# Patient Record
Sex: Female | Born: 1955 | Race: White | Hispanic: No | Marital: Married | Smoking: Never smoker
Health system: Southern US, Community
[De-identification: ages and names within clinical notes are randomized; demographics above are authoritative.]

## PROBLEM LIST (undated history)

## (undated) DIAGNOSIS — M479 Spondylosis, unspecified: Secondary | ICD-10-CM

## (undated) DIAGNOSIS — C50919 Malignant neoplasm of unspecified site of unspecified female breast: Secondary | ICD-10-CM

## (undated) DIAGNOSIS — T7840XA Allergy, unspecified, initial encounter: Secondary | ICD-10-CM

---

## 2011-03-06 ENCOUNTER — Ambulatory Visit
Admission: RE | Admit: 2011-03-06 | Discharge: 2011-03-06 | Disposition: A | Discharge status: Home / Self Care | Payer: BC Managed Care – PPO | Source: Ambulatory Visit | Attending: Family Medicine | Admitting: Family Medicine

## 2011-03-06 ENCOUNTER — Other Ambulatory Visit: Payer: Self-pay | Admitting: Family Medicine

## 2011-03-06 DIAGNOSIS — M545 Low back pain, unspecified: Secondary | ICD-10-CM

## 2011-04-17 ENCOUNTER — Other Ambulatory Visit: Payer: Self-pay | Admitting: Family Medicine

## 2011-04-17 DIAGNOSIS — Z1231 Encounter for screening mammogram for malignant neoplasm of breast: Secondary | ICD-10-CM

## 2011-05-09 ENCOUNTER — Ambulatory Visit
Admission: RE | Admit: 2011-05-09 | Discharge: 2011-05-09 | Disposition: A | Discharge status: Home / Self Care | Payer: BC Managed Care – PPO | Source: Ambulatory Visit | Attending: Family Medicine | Admitting: Family Medicine

## 2011-05-09 DIAGNOSIS — Z1231 Encounter for screening mammogram for malignant neoplasm of breast: Secondary | ICD-10-CM

## 2011-07-10 ENCOUNTER — Ambulatory Visit: Payer: BC Managed Care – PPO | Attending: Family Medicine

## 2011-07-10 DIAGNOSIS — M545 Low back pain, unspecified: Secondary | ICD-10-CM | POA: Insufficient documentation

## 2011-07-10 DIAGNOSIS — IMO0001 Reserved for inherently not codable concepts without codable children: Secondary | ICD-10-CM | POA: Insufficient documentation

## 2011-07-10 DIAGNOSIS — R5381 Other malaise: Secondary | ICD-10-CM | POA: Insufficient documentation

## 2011-07-10 DIAGNOSIS — M25559 Pain in unspecified hip: Secondary | ICD-10-CM | POA: Insufficient documentation

## 2011-07-20 ENCOUNTER — Ambulatory Visit: Payer: BC Managed Care – PPO

## 2011-07-23 ENCOUNTER — Ambulatory Visit: Payer: BC Managed Care – PPO | Admitting: Physical Therapy

## 2011-07-26 ENCOUNTER — Ambulatory Visit: Payer: BC Managed Care – PPO

## 2011-08-06 ENCOUNTER — Ambulatory Visit: Payer: BC Managed Care – PPO | Attending: Family Medicine

## 2011-08-06 DIAGNOSIS — M25559 Pain in unspecified hip: Secondary | ICD-10-CM | POA: Insufficient documentation

## 2011-08-06 DIAGNOSIS — M545 Low back pain, unspecified: Secondary | ICD-10-CM | POA: Insufficient documentation

## 2011-08-06 DIAGNOSIS — R5381 Other malaise: Secondary | ICD-10-CM | POA: Insufficient documentation

## 2011-08-06 DIAGNOSIS — IMO0001 Reserved for inherently not codable concepts without codable children: Secondary | ICD-10-CM | POA: Insufficient documentation

## 2011-08-08 ENCOUNTER — Ambulatory Visit: Payer: BC Managed Care – PPO

## 2012-09-02 ENCOUNTER — Other Ambulatory Visit: Payer: Self-pay

## 2012-09-02 DIAGNOSIS — Z1231 Encounter for screening mammogram for malignant neoplasm of breast: Secondary | ICD-10-CM

## 2012-10-07 ENCOUNTER — Ambulatory Visit
Admission: RE | Admit: 2012-10-07 | Discharge: 2012-10-07 | Disposition: A | Discharge status: Home / Self Care | Payer: BC Managed Care – PPO | Source: Ambulatory Visit

## 2012-10-07 DIAGNOSIS — Z1231 Encounter for screening mammogram for malignant neoplasm of breast: Secondary | ICD-10-CM

## 2012-10-08 ENCOUNTER — Other Ambulatory Visit: Payer: Self-pay | Admitting: Family Medicine

## 2012-10-08 DIAGNOSIS — R928 Other abnormal and inconclusive findings on diagnostic imaging of breast: Secondary | ICD-10-CM

## 2012-10-27 ENCOUNTER — Other Ambulatory Visit (HOSPITAL_COMMUNITY): Payer: Self-pay | Admitting: Diagnostic Radiology

## 2012-10-27 ENCOUNTER — Ambulatory Visit
Admission: RE | Admit: 2012-10-27 | Discharge: 2012-10-27 | Disposition: A | Discharge status: Home / Self Care | Payer: BC Managed Care – PPO | Source: Ambulatory Visit | Attending: Family Medicine | Admitting: Family Medicine

## 2012-10-27 DIAGNOSIS — R928 Other abnormal and inconclusive findings on diagnostic imaging of breast: Secondary | ICD-10-CM

## 2012-10-28 ENCOUNTER — Other Ambulatory Visit: Payer: Self-pay | Admitting: Family Medicine

## 2012-10-28 DIAGNOSIS — C50912 Malignant neoplasm of unspecified site of left female breast: Secondary | ICD-10-CM

## 2012-10-29 ENCOUNTER — Other Ambulatory Visit (HOSPITAL_COMMUNITY)
Admission: RE | Admit: 2012-10-29 | Discharge: 2012-10-29 | Disposition: A | Discharge status: Home / Self Care | Payer: BC Managed Care – PPO | Source: Ambulatory Visit | Attending: Family Medicine | Admitting: Family Medicine

## 2012-10-29 ENCOUNTER — Other Ambulatory Visit: Payer: Self-pay | Admitting: Family Medicine

## 2012-10-29 DIAGNOSIS — Z124 Encounter for screening for malignant neoplasm of cervix: Secondary | ICD-10-CM | POA: Insufficient documentation

## 2012-10-29 DIAGNOSIS — Z1151 Encounter for screening for human papillomavirus (HPV): Secondary | ICD-10-CM | POA: Insufficient documentation

## 2012-11-04 ENCOUNTER — Ambulatory Visit (INDEPENDENT_AMBULATORY_CARE_PROVIDER_SITE_OTHER): Payer: BC Managed Care – PPO | Admitting: Surgery

## 2012-11-04 ENCOUNTER — Ambulatory Visit
Admission: RE | Admit: 2012-11-04 | Discharge: 2012-11-04 | Disposition: A | Discharge status: Home / Self Care | Payer: BC Managed Care – PPO | Source: Ambulatory Visit | Attending: Family Medicine | Admitting: Family Medicine

## 2012-11-04 ENCOUNTER — Encounter (INDEPENDENT_AMBULATORY_CARE_PROVIDER_SITE_OTHER): Payer: Self-pay | Admitting: Surgery

## 2012-11-04 ENCOUNTER — Other Ambulatory Visit (INDEPENDENT_AMBULATORY_CARE_PROVIDER_SITE_OTHER): Payer: Self-pay

## 2012-11-04 VITALS — BP 118/76 | HR 82 | Temp 98.0°F | Resp 15

## 2012-11-04 DIAGNOSIS — D0591 Unspecified type of carcinoma in situ of right breast: Secondary | ICD-10-CM

## 2012-11-04 DIAGNOSIS — D0590 Unspecified type of carcinoma in situ of unspecified breast: Secondary | ICD-10-CM

## 2012-11-04 DIAGNOSIS — C50912 Malignant neoplasm of unspecified site of left female breast: Secondary | ICD-10-CM

## 2012-11-04 DIAGNOSIS — C50919 Malignant neoplasm of unspecified site of unspecified female breast: Secondary | ICD-10-CM | POA: Insufficient documentation

## 2012-11-04 MED ORDER — GADOBENATE DIMEGLUMINE 529 MG/ML IV SOLN
20.0000 mL | Freq: Once | INTRAVENOUS | Status: AC | PRN
  Administered 2012-11-04: 20 mL via INTRAVENOUS

## 2012-11-04 NOTE — Progress Notes (Addendum)
Re:   Shelby Collier DOB:   18-Sep-1955 MRN:   [id number]  ASSESSMENT AND PLAN: 1.  Left breast cancer, DCIS (Tis, N0)  ER - 100%, PR - 100%  11.3 cm area in the UOQ and LOQ of the left breast that is abnormal on MRI.  I discussed the options for breast cancer treatment with the patient.  I discussed a multidisciplinary approach to the treatment of breast cancer, which includes medical oncology and radiation oncology.  I discussed the surgical options of lumpectomy vs. mastectomy.  If mastectomy, there is the possibility of reconstruction.   I discussed the options of lymph node biopsy.  The treatment plan depends on the pathologic staging of the tumor and the patient's personal wishes.  The risks of surgery include, but are not limited to, bleeding, infection, the need for further surgery, and nerve injury.  The patient has been given literature on the treatment of breast cancer.  I expect that she will need a left mastectomy - but the patient was not ready to proceed with that today.  My review of the MRI set her back from what she expected.  Plan:  1) biopsy right breast - MRI guided, 2) consult plastic surgery, 3) consult radiation oncology. I will talk to her after the right breast biopsy.  [Talked to patient by phone.  She's in [place].  She wants a second left breast biopsy.  I discussed Tamoxifen with her.  But she did not commit to that.  She'll check with [place] tomorrow when she gets back.  DN  11/06/2012] [Second biopsy on 11/11/2012 of left breast showed DCIS.  She'll need a mastectomy on the left.  Her biopsy on the right showed a papilloma - so she'll need a lumpectomy on the right.  She saw Dr. Michell Heinrich and Dr. Welton Flakes 11/13/2012 - she was placed on Anastrozole by Dr. Welton Flakes while she travels.  She saw Dr. Odis Luster, but did not like his options.  So she has an appt at [facility] 12/17/2012 with a Engineer, petroleum.  She is going to [place].  She will call me back after she gets her opinion at [facility].   I talked to her on the phone.  DN  11/17/2012]  2.  Right breast breast abnormality on MRI  This needs a biopsy to decide the next step. 3.  Busy schedule  In the mix of this diagnosis, she is a very busy women with a lot of travel planned for the next 2 months.  Chief Complaint  Patient presents with  . New Evaluation    new br ca   REFERRING PHYSICIAN: Emeterio Reeve, MD  HISTORY OF PRESENT ILLNESS: Shelby Collier is a 57 y.o. (DOB: [birth year])  whtie  female whose primary care physician is Emeterio Reeve, MD and comes to me today for left breast cancer.  She is by herself.  She is very healthy.  Her mother had breast cancer at age 70.  Her mother had a lumpectomy, then required a mastectomy for DCIS.  She had a reconstruction with a TRAM, but Shelby Collier was not impressed with the results.  Her LMP was Feb 2011.  She is not on hormone replacement.  Her last mammogram was about 16 months ago.  Mammogram 10/07/2012 - Breast density category 3, Microcalcifications in the left breast. Biopsy of left breast - 10/27/2012 - DCIS  (Accession: [id number]) MRI - 11/04/2012 - non mass enhancement in upper and lower outer quadrant - 11.3 cm  in AP dimension   History reviewed. No pertinent past medical history.   Past Surgical History  Procedure Laterality Date  . Cesarean section  1994 1996    2x     Current Outpatient Prescriptions  Medication Sig Dispense Refill  . ibuprofen (ADVIL,MOTRIN) 200 MG tablet Take 200 mg by mouth every 6 (six) hours as needed for pain.       No current facility-administered medications for this visit.    Allergies  Allergen Reactions  . Codeine Nausea And Vomiting   REVIEW OF SYSTEMS: Skin:  No history of rash.  No history of abnormal moles. Infection:  No history of hepatitis or HIV.  No history of MRSA. Neurologic:  No history of stroke.  No history of seizure.  No history of headaches. Cardiac:  No history of hypertension. No history of heart  disease.   No history of seeing a cardiologist. Pulmonary:  Does not smoke cigarettes.  No asthma or bronchitis.  No OSA/CPAP.  Endocrine:  No diabetes. No thyroid disease. Gastrointestinal:  No history of stomach disease.  No history of liver disease.  No history of gall bladder disease.  No history of pancreas disease.  No history of colon disease. Has never had a colonoscopy, but she knows she needs this done. Urologic:  No history of kidney stones.  No history of bladder infections. Musculoskeletal:  No history of joint or back disease. Hematologic:  No bleeding disorder.  No history of anemia.  Not anticoagulated. Psycho-social:  The patient is oriented.   The patient has no obvious psychologic or social impairment to understanding our conversation and plan.  SOCIAL and FAMILY HISTORY: Married. She works as Personal assistant. He husband has a business that takes him to [place].  He makes wearable devices. Theymoved from [place], [place] about 4 years ago. Two children 18, 19 at [facility] [employer].  PHYSICAL EXAM: BP 118/76  Pulse 82  Temp(Src) 98 F (36.7 C) (Temporal)  Resp 15  General: WN WF who is alert and generally healthy appearing.  Has long white hair. HEENT: Normal. Pupils equal. Neck: Supple. No mass.  No thyroid mass. Lymph Nodes:  No supraclavicular, cervical, or axillary node. Lungs: Clear to auscultation and symmetric breath sounds. Heart:  RRR. No murmur or rub. Breasts:  Right:  No mass.  Left:  No mass.  Bruise at 3 o'clock from biopsy.  Abdomen: Soft. No mass. No tenderness. No hernia. Normal bowel sounds.  Pfannenstiel incision from C sections. Extremities:  Good strength and ROM  in upper and lower extremities. Neurologic:  Grossly intact to motor and sensory function. Psychiatric: Has normal mood and affect. Behavior is normal.   DATA REVIEWED: Path and MRI.  Copies to patient.  Ovidio Kin, MD,  [facility] [address]   [address]   [place], [place]    [address] Phone:  [phone] FAX:  [phone]

## 2012-11-05 ENCOUNTER — Other Ambulatory Visit (INDEPENDENT_AMBULATORY_CARE_PROVIDER_SITE_OTHER): Payer: Self-pay | Admitting: Surgery

## 2012-11-05 ENCOUNTER — Telehealth (INDEPENDENT_AMBULATORY_CARE_PROVIDER_SITE_OTHER): Payer: Self-pay | Admitting: General Surgery

## 2012-11-05 DIAGNOSIS — D0591 Unspecified type of carcinoma in situ of right breast: Secondary | ICD-10-CM

## 2012-11-05 DIAGNOSIS — R928 Other abnormal and inconclusive findings on diagnostic imaging of breast: Secondary | ICD-10-CM

## 2012-11-05 NOTE — Telephone Encounter (Signed)
Patient is aware of appt with Dr Odis Luster 11/13/12 730am

## 2012-11-07 ENCOUNTER — Other Ambulatory Visit (INDEPENDENT_AMBULATORY_CARE_PROVIDER_SITE_OTHER): Payer: Self-pay

## 2012-11-07 DIAGNOSIS — D0592 Unspecified type of carcinoma in situ of left breast: Secondary | ICD-10-CM

## 2012-11-07 DIAGNOSIS — R928 Other abnormal and inconclusive findings on diagnostic imaging of breast: Secondary | ICD-10-CM

## 2012-11-07 DIAGNOSIS — D0591 Unspecified type of carcinoma in situ of right breast: Secondary | ICD-10-CM

## 2012-11-11 ENCOUNTER — Ambulatory Visit
Admission: RE | Admit: 2012-11-11 | Discharge: 2012-11-11 | Disposition: A | Discharge status: Home / Self Care | Payer: BC Managed Care – PPO | Source: Ambulatory Visit | Attending: Surgery | Admitting: Surgery

## 2012-11-11 MED ORDER — GADOBENATE DIMEGLUMINE 529 MG/ML IV SOLN
20.0000 mL | Freq: Once | INTRAVENOUS | Status: AC | PRN
  Administered 2012-11-11: 20 mL via INTRAVENOUS

## 2012-11-12 ENCOUNTER — Ambulatory Visit: Payer: BC Managed Care – PPO | Admitting: Radiation Oncology

## 2012-11-12 NOTE — Progress Notes (Signed)
Location of Breast Cancer:left bx  3 o'clock  10/27/12  Left Breast MRI BX7/15/14=upper outer and lower outer 11.3cm non mass enhancement=per Dr.Hillard phone  Call, will sign   reults shortly,  Left upper outer quad=Ductal carcinoma in situ,  Right breast  MRI bx on 11/11/12 pending=lower inner 3 cm non mass enhancement= Lower inner quad= intraductal (partial tissue), fibrocystic changes,hyperplasia,intermediate grade   Histology per Pathology Report: 11/11/12: Right and Left BXs:  10/27/12 ;Breast, left, needle core biopsy- DUCTAL CARCINOMA IN SITU WITH CALCIFICATIONS  Receptor Status: ER(+), PR (+), Her2-neu(  )  Did patient present with symptoms (if so, please note symptoms) or was this found on screening mammography?: mammogram 10/07/12, MRI 11/04/12  Past/Anticipated interventions by surgeon, if UJW:JXBJY breast bx 11/11/12 done,pending, plastic surgery consult,possible  Reconstructive,(  lumpectomy/vs mastectomy discussed) Dr. Ovidio Kin, met with Dr. Ezzard Standing last week and Dr. Odis Luster this am 11/13/12, discusses litismuss  flap and small implant  Past/Anticipated interventions by medical oncology, if any: Chemotherapy No,  Lymphedema issues, if any:  NO Pain issues, if any:  SAFETY ISSUES: yes, b/l breast, bruising  From recent bx's, hurts from pressure of bra removing , steri strips and bandaid over b/l breasts  Prior radiation? no  Pacemaker/ICD? no  Possible current pregnancy?no  Is the patient on methotrexate? 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

## 2012-11-13 ENCOUNTER — Encounter: Payer: Self-pay | Admitting: Radiation Oncology

## 2012-11-13 ENCOUNTER — Encounter: Payer: Self-pay | Admitting: Oncology

## 2012-11-13 ENCOUNTER — Other Ambulatory Visit (HOSPITAL_BASED_OUTPATIENT_CLINIC_OR_DEPARTMENT_OTHER): Payer: BC Managed Care – PPO | Admitting: Lab

## 2012-11-13 ENCOUNTER — Ambulatory Visit
Admission: RE | Admit: 2012-11-13 | Discharge: 2012-11-13 | Disposition: A | Discharge status: Home / Self Care | Payer: BC Managed Care – PPO | Source: Ambulatory Visit | Attending: Radiation Oncology | Admitting: Radiation Oncology

## 2012-11-13 ENCOUNTER — Other Ambulatory Visit: Payer: Self-pay | Admitting: *Deleted

## 2012-11-13 ENCOUNTER — Telehealth: Payer: Self-pay | Admitting: *Deleted

## 2012-11-13 ENCOUNTER — Ambulatory Visit (HOSPITAL_BASED_OUTPATIENT_CLINIC_OR_DEPARTMENT_OTHER): Payer: BC Managed Care – PPO | Admitting: Oncology

## 2012-11-13 ENCOUNTER — Ambulatory Visit: Payer: BC Managed Care – PPO

## 2012-11-13 VITALS — BP 136/81 | HR 79 | Temp 97.8°F | Resp 20 | Ht 64.0 in | Wt 216.6 lb

## 2012-11-13 VITALS — BP 139/88 | HR 84 | Temp 98.5°F | Resp 20 | Ht 64.0 in | Wt 215.9 lb

## 2012-11-13 DIAGNOSIS — Z170 Estrogen receptor positive status [ER+]: Secondary | ICD-10-CM

## 2012-11-13 DIAGNOSIS — D0590 Unspecified type of carcinoma in situ of unspecified breast: Secondary | ICD-10-CM

## 2012-11-13 DIAGNOSIS — C50911 Malignant neoplasm of unspecified site of right female breast: Secondary | ICD-10-CM

## 2012-11-13 DIAGNOSIS — C50912 Malignant neoplasm of unspecified site of left female breast: Secondary | ICD-10-CM

## 2012-11-13 DIAGNOSIS — D249 Benign neoplasm of unspecified breast: Secondary | ICD-10-CM | POA: Insufficient documentation

## 2012-11-13 DIAGNOSIS — Z780 Asymptomatic menopausal state: Secondary | ICD-10-CM | POA: Insufficient documentation

## 2012-11-13 DIAGNOSIS — D0510 Intraductal carcinoma in situ of unspecified breast: Secondary | ICD-10-CM

## 2012-11-13 HISTORY — DX: Spondylosis, unspecified: M47.9

## 2012-11-13 HISTORY — DX: Allergy, unspecified, initial encounter: T78.40XA

## 2012-11-13 HISTORY — DX: Malignant neoplasm of unspecified site of unspecified female breast: C50.919

## 2012-11-13 LAB — COMPREHENSIVE METABOLIC PANEL (CC13)
Albumin: 4.2 g/dL (ref 3.5–5.0)
BUN: 9.6 mg/dL (ref 7.0–26.0)
CO2: 26 mEq/L (ref 22–29)
Calcium: 9.9 mg/dL (ref 8.4–10.4)
Chloride: 106 mEq/L (ref 98–109)
Glucose: 124 mg/dl (ref 70–140)
Potassium: 3.7 mEq/L (ref 3.5–5.1)
Total Protein: 7.2 g/dL (ref 6.4–8.3)

## 2012-11-13 LAB — CBC WITH DIFFERENTIAL/PLATELET
Basophils Absolute: 0.1 10*3/uL (ref 0.0–0.1)
Eosinophils Absolute: 0 10*3/uL (ref 0.0–0.5)
HGB: 12.6 g/dL (ref 11.6–15.9)
MCV: 87.4 fL (ref 79.5–101.0)
MONO#: 0.2 10*3/uL (ref 0.1–0.9)
MONO%: 4.6 % (ref 0.0–14.0)
NEUT#: 2.9 10*3/uL (ref 1.5–6.5)
RDW: 13.3 % (ref 11.2–14.5)
WBC: 5.3 10*3/uL (ref 3.9–10.3)
lymph#: 2.1 10*3/uL (ref 0.9–3.3)

## 2012-11-13 MED ORDER — ANASTROZOLE 1 MG PO TABS: 1.0000 mg | ORAL_TABLET | Freq: Every day | ORAL | Status: AC

## 2012-11-13 NOTE — Progress Notes (Signed)
Radiation Oncology         [phone]) [phone] ________________________________  Initial outpatient Consultation - Date: 11/13/2012   Name: SHANIKIA KERNODLE MRN: [id number]   DOB: 16-Aug-1955  REFERRING PHYSICIAN: Kandis Cocking, MD  DIAGNOSIS: The encounter diagnosis was Breast cancer, left.  HISTORY OF PRESENT ILLNESS::Cataleyah L Pitter is a 57 y.o. female  who underwent a screening mammogram in June of this year. Calcifications were noted in the left breast. Followup images were performed which showed worrisome calcifications. A stereotactic guided biopsy was performed which showed DCIS with calcifications which was low to intermediate grade. The tumor was 100% estrogen +100% progesterone positive. An MRI of the bilateral breasts was performed on 11/05/2012 which showed an 11.3 cm area of non-masslike enhancement in the upper and lower outer left breast. An area of non-mass enhancement was also noted in the lower inner right breast measuring 1.9 x 2.7 x 3 cm. No abnormal appearing lymph nodes were noted. 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 She is recovered well from her biopsies. Her left breast is tender. She has some bruising associated with her biopsy sites. She did not palpate anything in her left breast. Interestingly she had a mother with a similar history of DCIS with comedonecrosis treated with local excision followed by multiple re\re excisions and eventually mastectomy followed by TRAM flap reconstruction 25 years ago. She has 2 sons. She is GX P2. She is postmenopausal. She was never on hormone replacement. She has multiple work-related of sentence in the upcoming months. In particular she and her sons have a trip planned to visit her husband in [place]. He lives there permanently. This trip is scheduled  from July 30 2 mid August. She has discussed mastectomy with Dr. Ezzard Standing and has met with Dr. Odis Luster regarding reconstruction options.  PREVIOUS RADIATION THERAPY: No  PAST MEDICAL HISTORY:  has a past medical history of Breast cancer; Allergy; and Spondylosis.    PAST SURGICAL HISTORY: Past Surgical History  Procedure Laterality Date  . Cesarean section  1994 1996    2x     FAMILY HISTORY:  Family History  Problem Relation Age of Onset  . Cancer Mother   . Cancer Father     SOCIAL HISTORY:  History  Substance Use Topics  . Smoking status: Never Smoker   . Smokeless tobacco: Never Used  . Alcohol Use: 3.0 oz/week    5 Glasses of wine per week     Comment: weekly    ALLERGIES: Codeine  MEDICATIONS:  Current Outpatient Prescriptions  Medication Sig Dispense Refill  . ibuprofen (ADVIL,MOTRIN) 200 MG tablet Take 200 mg by mouth every 6 (six) hours as needed for pain.       No current facility-administered medications for this encounter.    REVIEW OF SYSTEMS:  A 15 point review of systems is documented in the electronic medical record. This was obtained by the nursing staff. However, I reviewed this with the patient to discuss relevant findings and make appropriate changes.  Pertinent items are noted in HPI.   PHYSICAL EXAM:  Filed Vitals:   11/13/12 1035  BP: 136/81  Pulse: 79  Temp: 97.8 F (36.6 C)  Resp: 20  .216 lb 9.6 oz (98.249 kg). She is a pleasant female in no distress sitting comfortably on examining table.  She has no palpable cervical supraclavicular or axillary adenopathy bilaterally. She has bruising in the inner quadrant of the right breast. There is no palpable abnormalities. She has some bruising in the outer quadrant of the left breast and her left breast is tender to palpation. No palpable masses. She is alert and a x3. She has 5 out of 5 strength bilaterally.  LABORATORY DATA:  No results found for this basename: WBC, HGB, HCT, MCV, PLT   No  results found for this basename: NA, K, CL, CO2   No results found for this basename: ALT, AST, GGT, ALKPHOS, BILITOT     RADIOGRAPHY: Mr Breast Bilateral W Wo Contrast  11/05/2012   **ADDENDUM** CREATED: 11/05/2012 08:56:17  Please note that in the findings section of the report under "left breast," the first sentence should read: There is non  mass enhancement in the upper and lower outer LEFT breast, spanning up to 11.3 cm in AP dimension.  Addended by:  Jerene Dilling, M.D. on 11/05/2012 08:56:17.  **END ADDENDUM** SIGNED BY: Jerene Dilling, M.D.  11/04/2012   *RADIOLOGY REPORT*  Clinical Data: 57 year-old female with recent diagnosis of DCIS.  LABS  BILATERAL BREAST MRI WITH AND WITHOUT CONTRAST  Technique: Multiplanar, multisequence MR images of both breasts were obtained prior to and following the intravenous administration of 20ml of Multihance.  THREE-DIMENSIONAL MR IMAGE RENDERING ON INDEPENDENT WORKSTATION:  Three-dimensional MR images were rendered by post-processing of the original MR data on an independent workstation.  The three- dimensional MR images were interpreted, and findings are reported in the following complete MRI report for this study.  Comparison:   Recent imaging examinations.  FINDINGS:  Breast composition:  c:  Heterogeneous fibroglandular tissue  Background parenchymal enhancement: Mild  Right breast: Non mass enhancement is identified in the lower inner right breast anteriorly, measuring 1.9 x 2.7 x 3 cm.  No other areas of abnormal enhancement.  Left breast:  There is non mass enhancement in the upper and lower outer right breast, spanning up to 11.3 cm in AP dimension.  This is concerning for DCIS.  More focal area of enhancement is also noted at the biopsy site in the lower outer left breast.  Lymph nodes:   No abnormal appearing lymph nodes.  Ancillary findings:  None.  IMPRESSION: 1.Non mass enhancement in the upper and lower outer left breast around the site of biopsy  proven DCIS. This is concerning for additional areas of DCIS.  2. Nonmass enhancement in the inner lower right breast.  RECOMMENDATION: MRI guided biopsy of non mass enhancement in the lower inner right breast.  BI-RADS CATEGORY 6:  Known biopsy-proven malignancy - appropriate action should be taken.   Original Report Authenticated By: Jerene Dilling, M.D.   Mm Digital Diag Ltd L  10/27/2012   *RADIOLOGY REPORT*  Clinical Data:  Called back from screening mammogram for calcifications left breast  DIGITAL DIAGNOSTIC LEFT MAMMOGRAM October 27, 2012  Comparison: October 08, 2012, May 10, 2011  Findings:  ACR Breast Density Category c :  Heterogeneously dense  Spot magnification CC and lateral views of the left breast are submitted.  There are indeterminate microcalcifications in the lateral left breast.  IMPRESSION: Suspicious findings.  RECOMMENDATION: Stereotactic core biopsy left breast calcifications.  I have discussed the findings and recommendations with the patient. Results were also provided in writing at the conclusion of the visit.  If applicable, a reminder letter will be sent to the patient regarding her next  appointment.  BI-RADS CATEGORY 4:  Suspicious abnormality - biopsy should be considered.   Original Report Authenticated By: Sherian Rein, M.D.   Mm Digital Diagnostic Bilat  11/11/2012   *RADIOLOGY REPORT*  Clinical Data:  Titanium marker placement following MR guided core needle biopsy earlier today.  DIGITAL DIAGNOSTIC BILATERAL MAMMOGRAM  Comparison:  Previous examinations.  Findings:  Digital mammographic images were performed following MR guided biopsy of the posterior, superior aspect of an 11.3 cm area of non mass enhancement in the outer left breast and a 3 cm area of non mass enhancement in the lower inner quadrant of the right breast.  The images of the left breast demonstrate an hourglass shaped biopsy marker clip in the posterior aspect of the outer left breast, slightly  superiorly, at the location of the area of non mass enhancement targeted for biopsy earlier today.  This is located 4.4 cm posterior, superior and lateral to the previously placed T-shaped biopsy marker clip at the location recently biopsied ductal carcinoma in situ.  The images of the right breast demonstrate an hourglass shaped biopsy marker clip in the anterior aspect of the inferior right breast, slightly medially, at the medial aspect of the targeted 3 cm area of non mass enhancement on the recent staging MR.  The biopsy cavity is at the location of the targeted area of concern.  IMPRESSION: Appropriate bilateral clip placement, as described above.   Original Report Authenticated By: Beckie Salts, M.D.   Mm Lt Breast Bx W Loc Dev 1st Lesion Image Bx Spec Stereo Guide  10/28/2012   **ADDENDUM** CREATED: 10/28/2012 13:52:12  Biopsy results revealed ductal carcinoma in situ, low and intermediate grade, concordant with imaging findings. Results were discussed with the patient by telephone at 11:50 a.m. on 10/28/2012. A surgical consultation is made with Dr. Ezzard Standing on 11/04/2012 at 02:15 p.m.  An MRI is scheduled for 11/04/2012 at 08:45 a.m. The patient states she is doing well after the biopsy.  **END ADDENDUM** SIGNED BY: Hiram Gash, M.D.  10/27/2012   *RADIOLOGY REPORT*  Clinical Data:  Suspicious left breast calcifications for biopsy.  STEREOTACTIC-GUIDED VACUUM ASSISTED BIOPSY OF THE LEFT BREAST AND SPECIMEN RADIOGRAPH  Comparison: Previous exams.  I met with the patient and we discussed the procedure of stereotactic-guided biopsy, including benefits and alternatives. We discussed the high likelihood of a successful procedure. We discussed the risks of the procedure, including infection, bleeding, tissue injury, clip migration, and inadequate sampling. Informed, written consent was given. The usual time-out protocol was performed immediately prior to the procedure.  Using sterile technique and 2%  Lidocaine as local anesthetic, under stereotactic guidance, a 9 gauge vacuum-assisted device was used to perform core needle biopsy of calcifications in the lateral left breast using a lateral approach.  Specimen radiograph was performed, showing inclusion of calcifications of concern. Specimens with calcifications are identified for pathology.  At the conclusion of the procedure, a  tissue marker clip was deployed into the biopsy cavity.  Follow-up 2-view mammogram confirmed clip in the area of concern.  IMPRESSION: Stereotactic-guided biopsy of left breast.  No apparent complications.   Original Report Authenticated By: Sherian Rein, M.D.   Mr Oswaldo Milian Breast Bx Jones Bales Dev 1st Lesion Image Bx Spec Mr Guide  11/11/2012   *RADIOLOGY REPORT*  Clinical Data: Recently diagnosed left breast ductal carcinoma in situ.  11.3 cm area of non mass enhancement in the upper outer and lower outer left breast on a staging breast MR. The previously  biopsied ductal carcinoma in situ is within the inferior, anterior portion of this area of enhancement.  MRI GUIDED VACUUM ASSISTED BIOPSY OF THE LEFT BREAST WITHOUT AND WITH CONTRAST  Technique: Multiplanar, multisequence MR images of the left breast were obtained prior to and following the intravenous administration of 20 ml of Multihance.  I met with the patient, and we discussed the procedure of MRI guided biopsy, including the risks of bleeding, infection and clip migration.  We discussed the high likelihood of a successful procedure.  Informed, written consent was given.  Using sterile technique, 2% Lidocaine, MRI guidance, and a 9 gauge vacuum assisted device, biopsy was performed of the posterior, superior aspect of the 11.3 cm area of non mass enhancement seen in the outer left breast on the recent staging MR.  At the conclusion of the procedure, a tissue marker clip was deployed into the biopsy cavity.  Follow-up 2-view mammogram was performed and dictated separately.  IMPRESSION:  MRI guided biopsy of the posterior, superior aspect of the recently demonstrated 11.3 cm area of abnormal enhancement in the outer left breast.  No apparent complications.  THREE-DIMENSIONAL MR IMAGE RENDERING ON INDEPENDENT WORKSTATION:  Three-dimensional MR images were rendered by post-processing of the original MR data on an independent workstation.  The three- dimensional MR images were interpreted, and findings were reported in the accompanying complete MRI report for this study.   Original Report Authenticated By: Beckie Salts, M.D.   Mr Rt Breast Bx Jones Bales Dev 1st Lesion Image Bx Spec Mr Guide  11/11/2012   *RADIOLOGY REPORT*  Clinical Data: 3 cm area of non mass enhancement in the lower inner right breast on a recent staging breast MR.  Recently diagnosed left breast ductal carcinoma in situ.  MRI GUIDED VACUUM ASSISTED BIOPSY OF THE RIGHT BREAST WITHOUT AND WITH CONTRAST  Technique: Multiplanar, multisequence MR images of the right breast were obtained prior to and following the intravenous administration of 20 ml of Multihance.  I met with the patient, and we discussed the procedure of MRI guided biopsy, including the risks of bleeding, infection and clip migration.  We discussed the high likelihood of a successful procedure.  Informed, written consent was given.  Using sterile technique, 2% Lidocaine, MRI guidance, and a 9 gauge vacuum assisted device, biopsy was performed of the recently demonstrated 3 cm area of non mass enhancement in the anterior aspect of the lower inner quadrant of the right breast.  At the conclusion of the procedure, a tissue marker clip was deployed into the biopsy cavity.  Follow-up 2-view mammogram was performed and dictated separately.  IMPRESSION: MRI guided biopsy of a 3 cm area of non mass enhancement in the lower inner quadrant of the right breast.  No apparent complications.  THREE-DIMENSIONAL MR IMAGE RENDERING ON INDEPENDENT WORKSTATION:  Three-dimensional MR images  were rendered by post-processing of the original MR data on an independent workstation.  The three- dimensional MR images were interpreted, and findings were reported in the accompanying complete MRI report for this study.   Original Report Authenticated By: Beckie Salts, M.D.      IMPRESSION: 57 year old female with newly diagnosed DCIS of the left breast and a intraductal papilloma of the right breast  PLAN: I discussed with Greig Castilla the results of her second biopsy which confirmed this large extent of DCIS. We discussed that mastectomy was recommended to 2 size of her lesion as compared to the size of her breast for cosmetic outcome. This was not due  to the aggressiveness of her cancer. I discussed with her there was no indication for postmastectomy radiation in patients who undergo mastectomy for DCIS. Even in the setting of focally positive margins radiation is not indicated. We discussed her options for reconstruction. She is uncomfortable with delaying her surgery and not undergoing any treatment while she is traveling for the next 4-6 weeks. We discussed perhaps placing her on antiestrogen therapy to help ridge her so that she had some oncologic treatment while she was traveling. Dr. Welton Flakes was able to schedule her for an appointment later this afternoon a. She can then discuss surgical planning options with Dr. Ezzard Standing and Dr. Odis Luster when she returns from her trip. She was grateful for her care here. I have not scheduled followup with me. I be happy to see her back at any point in the future and encouraged her to call me with any questions.  I spent 60 minutes  face to face with the patient and more than 50% of that time was spent in counseling and/or coordination of care.   ------------------------------------------------  Lurline Hare, MD

## 2012-11-13 NOTE — Patient Instructions (Addendum)
We discussed your diagnosis and pathology  We will begin neoadjuvant anti-estrogen   arimidex 1 mg daily  We discussed the side effects and more inforamtion as below  Please call with any problems  I will see you back in September or sonner if needed   Anastrozole tablets What is this medicine? ANASTROZOLE (an AS troe zole) is used to treat breast cancer in women who have gone through menopause. Some types of breast cancer depend on estrogen to grow, and this medicine can stop tumor growth by blocking estrogen production. This medicine may be used for other purposes; ask your health care provider or pharmacist if you have questions. What should I tell my health care provider before I take this medicine? They need to know if you have any of these conditions: -liver disease -an unusual or allergic reaction to anastrozole, other medicines, foods, dyes, or preservatives -pregnant or trying to get pregnant -breast-feeding How should I use this medicine? Take this medicine by mouth with a glass of water. Follow the directions on the prescription label. You can take this medicine with or without food. Take your doses at regular intervals. Do not take your medicine more often than directed. Do not stop taking except on the advice of your doctor or health care professional. Talk to your pediatrician regarding the use of this medicine in children. Special care may be needed. Overdosage: If you think you have taken too much of this medicine contact a poison control center or emergency room at once. NOTE: This medicine is only for you. Do not share this medicine with others. What if I miss a dose? If you miss a dose, take it as soon as you can. If it is almost time for your next dose, take only that dose. Do not take double or extra doses. What may interact with this medicine? Do not take this medicine with any of the following medications: -female hormones, like estrogens or progestins and birth  control pills This medicine may also interact with the following medications: -tamoxifen This list may not describe all possible interactions. Give your health care provider a list of all the medicines, herbs, non-prescription drugs, or dietary supplements you use. Also tell them if you smoke, drink alcohol, or use illegal drugs. Some items may interact with your medicine. What should I watch for while using this medicine? Visit your doctor or health care professional for regular checks on your progress. Let your doctor or health care professional know about any unusual vaginal bleeding. Do not treat yourself for diarrhea, nausea, vomiting or other side effects. Ask your doctor or health care professional for advice. What side effects may I notice from receiving this medicine? Side effects that you should report to your doctor or health care professional as soon as possible: -allergic reactions like skin rash, itching or hives, swelling of the face, lips, or tongue -any new or unusual symptoms -breathing problems -chest pain -leg pain or swelling -vomiting Side effects that usually do not require medical attention (report to your doctor or health care professional if they continue or are bothersome): -back or bone pain -cough, or throat infection -diarrhea or constipation -dizziness -headache -hot flashes -loss of appetite -nausea -sweating -weakness and tiredness -weight gain This list may not describe all possible side effects. Call your doctor for medical advice about side effects. You may report side effects to FDA at 1-800-FDA-1088. Where should I keep my medicine? Keep out of the reach of children. Store at room temperature  between 20 and 25 degrees C (68 and 77 degrees F). Throw away any unused medicine after the expiration date. NOTE: This sheet is a summary. It may not cover all possible information. If you have questions about this medicine, talk to your doctor, pharmacist,  or health care provider.  2013, [employer] Standard. (06/27/2007 4:31:52 PM)

## 2012-11-13 NOTE — Telephone Encounter (Signed)
appts made and printed. Date for 9/5/ per pt...td

## 2012-11-13 NOTE — Telephone Encounter (Signed)
Called patholgy, path still pending, spoke with Vata, and she spoke with Dr. Raynald Blend, "he is still waiting on some tissu results, and will call breast ctr when they come in, his number [phone], ", informed her patine has 1030 appt today with MD, will call around 1000am to see if results are in 8:48 AM

## 2012-11-13 NOTE — Telephone Encounter (Signed)
Called pathology again, spoke with [facility] who transferred call to DR. Hillard, he will sign report in next few minutes, will be able to accesses shortly,,thnaked MD

## 2012-11-13 NOTE — Progress Notes (Addendum)
Please see the Nurse Progress Note in the MD Initial Consult Encounter for this patient. 

## 2012-11-13 NOTE — Progress Notes (Signed)
Checked in new patient. Email address on file is her [address]. No financial issues. She has no POA/living will.

## 2012-12-10 NOTE — Progress Notes (Signed)
Shelby Collier [id number] 03-31-1956 57 y.o. 12/10/2012 11:27 PM  CC  Emeterio Reeve, MD [address] [place] [address] Dr. Ovidio Kin Dr. 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 Patient is seen in  medical oncology for discussion of treatment option  STAGE:   No matching staging information was found for the patient.  REFERRING PHYSICIAN: Dr. Ovidio Kin  HISTORY OF PRESENT ILLNESS:  Shelby Collier is a 57 y.o. female.  Who underwent a screening mammogram in June 2014. In the left breast there were calcifications noted. She had followup imaging done that revealed worrisome calcifications. 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 MRI of the bilateral breasts on 11/05/2012 revealed 11.3 cm area of non-masslike enhancement in the upper and lower outer left quadrant. An area of non-mass enhancement was also noted in the lower inner right breast measuring 1.9 x 2.7 x 3 cm. There were no abnormal lymph nodes. 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 Overall patient is doing well. She has been seen by Dr. Ovidio Kin she is also been seen by Dr. Lurline Hare.   Past Medical History: Past Medical History  Diagnosis Date  . Breast cancer     left  . Allergy   . Spondylosis     lunbar/scaral    Past Surgical History: Past Surgical History  Procedure Laterality Date  . Cesarean section  1994 1996    2x     Family History: Family History  Problem Relation Age of Onset  . Cancer Mother   . Cancer Father     Social History History  Substance Use Topics  . Smoking status: Never  Smoker   . Smokeless tobacco: Never Used  . Alcohol Use: 3.0 oz/week    5 Glasses of wine per week     Comment: weekly    Allergies: Allergies  Allergen Reactions  . Codeine Nausea And Vomiting    Current Medications: Current Outpatient Prescriptions  Medication Sig Dispense Refill  . anastrozole (ARIMIDEX) 1 MG tablet Take 1 tablet (1 mg total) by mouth daily.  30 tablet  6  . ibuprofen (ADVIL,MOTRIN) 200 MG tablet Take 200 mg by mouth every 6 (six) hours as needed for pain.       No current facility-administered medications for this visit.    OB/GYN History:patient had menarche at age 61 underwent menopause in 2011 no history of hormone replacement therapy. She's had first live birth at 67  Fertility Discussion: not applicable Prior History of Cancer: no prior history  Health Maintenance:  Colonoscopy  Bone Densityno no Last PAP smear July 2014  ECOG PERFORMANCE STATUS: 0 - Asymptomatic  Genetic Counseling/testing: yes  REVIEW OF SYSTEMS:  A comprehensive review of systems was negative.  PHYSICAL EXAMINATION: Blood pressure 139/88, pulse 84, temperature 98.5 F (36.9 C), temperature source Oral, resp. rate 20, height 5\' 4"  (1.626 m), weight 215 lb 14.4 oz (97.932 kg), last menstrual period 08/14/2009.  UUV:OZDGU, healthy, no distress, well nourished and well developed SKIN: skin color, texture, turgor are normal HEAD: Normocephalic EYES: PERRLA, EOMI, Conjunctiva are pink and non-injected, sclera clear EARS: External ears normal OROPHARYNX:no exudate and  dentition normal  NECK: no adenopathy LYMPH:  no palpable lymphadenopathy, no hepatosplenomegaly BREAST:right breast normal without mass, skin or nipple changes or axillary nodes, left breast normal without mass, skin or nipple changes or axillary nodes LUNGS: clear to auscultation and percussion HEART: regular rate & rhythm ABDOMEN:abdomen soft and non-tender BACK: No CVA tenderness EXTREMITIES:no edema   NEURO: alert & oriented x 3 with fluent speech, no focal motor/sensory deficits     STUDIES/RESULTS: Mm Digital Diagnostic Bilat  11/11/2012   *RADIOLOGY REPORT*  Clinical Data:  Titanium marker placement following MR guided core needle biopsy earlier today.  DIGITAL DIAGNOSTIC BILATERAL MAMMOGRAM  Comparison:  Previous examinations.  Findings:  Digital mammographic images were performed following MR guided biopsy of the posterior, superior aspect of an 11.3 cm area of non mass enhancement in the outer left breast and a 3 cm area of non mass enhancement in the lower inner quadrant of the right breast.  The images of the left breast demonstrate an hourglass shaped biopsy marker clip in the posterior aspect of the outer left breast, slightly superiorly, at the location of the area of non mass enhancement targeted for biopsy earlier today.  This is located 4.4 cm posterior, superior and lateral to the previously placed T-shaped biopsy marker clip at the location recently biopsied ductal carcinoma in situ.  The images of the right breast demonstrate an hourglass shaped biopsy marker clip in the anterior aspect of the inferior right breast, slightly medially, at the medial aspect of the targeted 3 cm area of non mass enhancement on the recent staging MR.  The biopsy cavity is at the location of the targeted area of concern.  IMPRESSION: Appropriate bilateral clip placement, as described above.   Original Report Authenticated By: Beckie Salts, M.D.   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  This is concordant with the imaging findings.  The initial pathological diagnosis was discussed with the patient by telephone on 11/12/2012 with the final results conveyed on 11/13/2012.  She reported some pain at the biopsy site with extensive bruising and no palpable hematoma.  She will  see Dr. Ezzard Standing in follow-up.  **END ADDENDUM** SIGNED BY: Londell Moh. Azucena Kuba, M.D.  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  11.3 cm area of non mass enhancement in the upper outer and lower outer left breast on a staging breast MR. The previously biopsied ductal carcinoma in situ is within the inferior, anterior portion of this area of enhancement.  MRI GUIDED VACUUM ASSISTED BIOPSY OF THE LEFT BREAST WITHOUT AND WITH CONTRAST  Technique: Multiplanar, multisequence MR images of the left breast were obtained prior to and following the intravenous administration of 20 ml of Multihance.  I met with the patient, and we discussed the procedure of MRI guided biopsy, including the risks of bleeding, infection and clip migration.  We discussed the high likelihood of a successful procedure.  Informed, written consent was given.  Using sterile technique, 2% Lidocaine, MRI guidance, and a 9 gauge vacuum assisted device, biopsy was performed of the posterior, superior aspect of the 11.3 cm area of non mass enhancement seen in the outer left breast on the recent staging MR.  At the conclusion of the procedure, a tissue marker clip was deployed into the biopsy cavity.  Follow-up 2-view  mammogram was performed and dictated separately.  IMPRESSION: MRI guided biopsy of the posterior, superior aspect of the recently demonstrated 11.3 cm area of abnormal enhancement in the outer left breast.  No apparent complications.  THREE-DIMENSIONAL MR IMAGE RENDERING ON INDEPENDENT WORKSTATION:  Three-dimensional MR images were rendered by post-processing of the original MR data on an independent workstation.  The three- dimensional MR images were interpreted, and findings were reported in the accompanying complete MRI report for this study.   Original Report Authenticated By: Beckie Salts, M.D.   Mr Rt Breast Bx Jones Bales Dev 1st Lesion Image Bx Spec Mr Guide  11/13/2012    **ADDENDUM** CREATED: 11/13/2012 14:08:37  The addendum stating that there was no bruising on followup was in error.  The patient did report extensive bruising following the biopsy with no palpable hematoma.  **END ADDENDUM** SIGNED BY: Londell Moh. Azucena Kuba, M.D.  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  This is concordant with the imaging findings. Surgical excision is recommended.  The final pathological diagnosis and recommendation were discussed with the patient by telephone on 11/12/2012.  Her questions were answered.  She reported some pain at the biopsy site with no bruising or palpable hematoma.  She will see Dr. Ezzard Standing in follow- up.  **END ADDENDUM** SIGNED BY: Londell Moh. Azucena Kuba, M.D.  11/11/2012   *RADIOLOGY REPORT*  Clinical Data: 3 cm area of non mass enhancement in the lower inner right breast on a recent staging breast MR.  Recently diagnosed left breast ductal carcinoma in situ.  MRI GUIDED VACUUM ASSISTED BIOPSY OF THE RIGHT BREAST WITHOUT AND WITH CONTRAST  Technique: Multiplanar, multisequence MR images of the right breast were obtained prior to and following the intravenous administration of 20 ml of Multihance.  I met with the patient, and we discussed the procedure of MRI guided biopsy, including the risks of bleeding, infection and clip migration.  We discussed the high likelihood of a successful procedure.  Informed, written consent was given.  Using sterile technique, 2% Lidocaine, MRI guidance, and a 9 gauge vacuum assisted device, biopsy was performed of the recently demonstrated 3 cm area of non mass enhancement in the anterior aspect of the lower inner quadrant of the right breast.  At the conclusion of the procedure, a tissue marker clip was deployed into the biopsy cavity.  Follow-up 2-view mammogram was performed and dictated separately.  IMPRESSION: MRI guided biopsy of a 3 cm area of non mass  enhancement in the lower inner quadrant of the right breast.  No apparent complications.  THREE-DIMENSIONAL MR IMAGE RENDERING ON INDEPENDENT WORKSTATION:  Three-dimensional MR images were rendered by post-processing of the original MR data on an independent workstation.  The three- dimensional MR images were interpreted, and findings were reported in the accompanying complete MRI report for this study.   Original Report Authenticated By: Beckie Salts, M.D.     LABS:    Chemistry      Component Value Date/Time   NA 141 11/13/2012 1248   K 3.7 11/13/2012 1248   CO2 26 11/13/2012 1248   BUN 9.6 11/13/2012 1248   CREATININE 0.9 11/13/2012 1248      Component Value Date/Time   CALCIUM 9.9 11/13/2012 1248   ALKPHOS 87 11/13/2012 1248   AST 16 11/13/2012 1248   ALT 15 11/13/2012 1248   BILITOT 0.43 11/13/2012 1248      Lab Results  Component Value Date  WBC 5.3 11/13/2012   HGB 12.6 11/13/2012   HCT 36.6 11/13/2012   MCV 87.4 11/13/2012   PLT 225 11/13/2012   PATHOLOGY: ADDITIONAL INFORMATION: PROGNOSTIC INDICATORS - ACIS Results: IMMUNOHISTOCHEMICAL AND MORPHOMETRIC ANALYSIS BY THE AUTOMATED CELLULAR IMAGING SYSTEM (ACIS) Estrogen Receptor: 100%, POSITIVE, STRONG STAINING INTENSITY Progesterone Receptor: 100%, POSITIVE, STRONG STAINING INTENSITY REFERENCE RANGE ESTROGEN RECEPTOR NEGATIVE <1% POSITIVE =>1% PROGESTERONE RECEPTOR NEGATIVE <1% POSITIVE =>1% All controls stained appropriately Pecola Leisure MD Pathologist, Electronic Signature ( Signed 11/03/2012) FINAL DIAGNOSIS Diagnosis Breast, left, needle core biopsy - DUCTAL CARCINOMA IN SITU WITH CALCIFICATIONS. - SEE COMMENT. Microscopic Comment The carcinoma appears low grade to intermediate grade. Estrogen receptor and progesterone receptor studies will be 1 of 2 Duplicate copy FINAL for FA[facility], FIFE L [phone]) Microscopic Comment(continued) performed and the results reported separately. The results were called to  [facility] on 10/28/12. (JBK:gt, 10/28/12) Pecola Leisure MD Pathologist, Electronic Signature (Case signed 10/28/2012) Diagnosis 1. Breast, left, needle core biopsy, UOQ posteriorly - DUCTAL CARCINOMA IN SITU. - SEE COMMENT. 2. Breast, right, needle core biopsy, LIQ anteriorly - INTRADUCTAL PAPILLOMA, SEE COMMENT. - FIBROCYSTIC CHANGES WITH USUAL DUCTAL HYPERPLASIA. - SEE COMMENT. Microscopic Comment 1. Immunohistochemical stains are performed on the left upper outer quadrant posterior biopsy. A p63, smooth muscle myosin heavy chain and calponin stain all highlight an intact myoepithelial layer. The morphology coupled with the immunohistochemical staining findings are consistent with the above diagnosis. Quantitative estrogen and quantitative progesterone markers will not be performed unless otherwise requested as quantitative markers were performed on the previous left needle core biopsy ([id number]). The ductal carcinoma in situ appears intermediate grade as sampled. The findings are [facility] 11/13/2012. Dr. Colonel Bald has seen the first specimen in consultation with agreement. 2. Although there is no atypia or malignancy identified in the right lower inner quadrant biopsy, the intraductal papilloma may be only partially sampled. Dr. Colonel Bald has seen the second specimen in consultation with agreement. The findings are called to [facility] on 11/13/2012. 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 Patient is a candidate for mastectomy with reconstruction. However patient at this time is unable to proceed with her surgery soon she is traveling out of the country. We had an extensive  discussion regarding this. She is going to be going away from July 30 2 mid August. She is more interested in seeing what she can do while she is traveling. Once she returns she will proceed with her definitive surgery which will be a mastectomy with reconstruction.  Clinical Trial Eligibility: no Multidisciplinary conference discussion yes     PLAN:    #1 we discussed role of adjuvant antiestrogen therapy. In this case it would be neoadjuvant antiestrogen therapy for a DCIS. We discussed using anastrozole 1 mg daily. This would be to get her through the trip. She understands that this may or not protect her however it is a option and she is willing to take this.  #2 Center prescription to her pharmacy of anastrozole 1 mg daily.  #3 once patient returns from her trip she will proceed with her definitive surgery.       Discussion: Patient is being treated per NCCN breast cancer care guidelines appropriate for stage.0  Thank you so much for allowing me to participate in the care of Shelby Collier. I will continue to follow up the patient with you and assist in her care.  All questions were answered. The patient knows to call the clinic with any problems, questions or concerns. We can certainly see the patient much sooner if necessary.  I spent 55 minutes counseling the patient face to face. The total time spent in the appointment was 55 minutes.

## 2013-01-02 ENCOUNTER — Ambulatory Visit (HOSPITAL_BASED_OUTPATIENT_CLINIC_OR_DEPARTMENT_OTHER): Payer: BC Managed Care – PPO | Admitting: Oncology

## 2013-01-02 ENCOUNTER — Encounter: Payer: Self-pay | Admitting: Oncology

## 2013-01-02 ENCOUNTER — Telehealth: Payer: Self-pay | Admitting: Oncology

## 2013-01-02 ENCOUNTER — Other Ambulatory Visit (HOSPITAL_BASED_OUTPATIENT_CLINIC_OR_DEPARTMENT_OTHER): Payer: BC Managed Care – PPO | Admitting: Lab

## 2013-01-02 VITALS — BP 134/91 | HR 77 | Temp 98.6°F | Resp 20 | Ht 64.0 in | Wt 210.8 lb

## 2013-01-02 DIAGNOSIS — D0590 Unspecified type of carcinoma in situ of unspecified breast: Secondary | ICD-10-CM

## 2013-01-02 DIAGNOSIS — C50919 Malignant neoplasm of unspecified site of unspecified female breast: Secondary | ICD-10-CM

## 2013-01-02 DIAGNOSIS — C50911 Malignant neoplasm of unspecified site of right female breast: Secondary | ICD-10-CM

## 2013-01-02 DIAGNOSIS — C50912 Malignant neoplasm of unspecified site of left female breast: Secondary | ICD-10-CM

## 2013-01-02 LAB — CBC WITH DIFFERENTIAL/PLATELET
Basophils Absolute: 0 10*3/uL (ref 0.0–0.1)
Eosinophils Absolute: 0 10*3/uL (ref 0.0–0.5)
HGB: 12.7 g/dL (ref 11.6–15.9)
NEUT#: 2.8 10*3/uL (ref 1.5–6.5)
RBC: 4.17 10*6/uL (ref 3.70–5.45)
RDW: 13 % (ref 11.2–14.5)
WBC: 5 10*3/uL (ref 3.9–10.3)
lymph#: 1.8 10*3/uL (ref 0.9–3.3)

## 2013-01-02 LAB — COMPREHENSIVE METABOLIC PANEL (CC13)
AST: 18 U/L (ref 5–34)
Albumin: 4 g/dL (ref 3.5–5.0)
Alkaline Phosphatase: 92 U/L (ref 40–150)
BUN: 12.4 mg/dL (ref 7.0–26.0)
Calcium: 9.8 mg/dL (ref 8.4–10.4)
Chloride: 109 mEq/L (ref 98–109)
Glucose: 106 mg/dl (ref 70–140)
Potassium: 4 mEq/L (ref 3.5–5.1)
Sodium: 142 mEq/L (ref 136–145)
Total Protein: 7 g/dL (ref 6.4–8.3)

## 2013-01-02 NOTE — Telephone Encounter (Signed)
, °

## 2013-01-18 NOTE — Progress Notes (Signed)
OFFICE PROGRESS NOTE  CC**  Emeterio Reeve, MD [address] [place] [address] Dr. Ovidio Kin  Dr. 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 In the left breast there were calcifications noted. She had followup imaging done that revealed worrisome calcifications. 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 MRI of the bilateral breasts on 11/05/2012 revealed 11.3 cm area of non-masslike enhancement in the upper and lower outer left quadrant. An area of non-mass enhancement was also noted in the lower inner right breast measuring 1.9 x 2.7 x 3 cm. There were no abnormal lymph nodes. 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 She was gone about 4-5 weeks.  CURRENT THERAPY:proceed with surgery  INTERVAL HISTORY: CAMAYA GANNETT 57 y.o. female returns forfollowup visit after returning from her a summer trip. Overall she tolerated the Arimidex without any problems. She denies any aches pains fevers chills night sweats headaches shortness of breath chest pains palpitations no peripheral paresthesias no vaginal discharge no lower extremity swelling. Remainder of the 10 point review of systems is negative  MEDICAL HISTORY: Past Medical History  Diagnosis Date  . Breast cancer     left  . Allergy   . Spondylosis    lunbar/scaral    ALLERGIES:  is allergic to codeine.  MEDICATIONS:  Current Outpatient Prescriptions  Medication Sig Dispense Refill  . anastrozole (ARIMIDEX) 1 MG tablet Take 1 mg by mouth daily.      Marland Kitchen ibuprofen (ADVIL,MOTRIN) 200 MG tablet Take 200 mg by mouth every 6 (six) hours as needed for pain.       No current facility-administered medications for this visit.    SURGICAL HISTORY:  Past Surgical History  Procedure Laterality Date  . Cesarean section  1994 1996    2x     REVIEW OF SYSTEMS:  Pertinent items are noted in HPI.   HEALTH MAINTENANCE:   PHYSICAL EXAMINATION: Blood pressure 134/91, pulse 77, temperature 98.6 F (37 C), temperature source Oral, resp. rate 20, height 5\' 4"  (1.626 m), weight 210 lb 12.8 oz (95.618 kg), last menstrual period 08/14/2009. Body mass index is 36.17 kg/(m^2). ECOG PERFORMANCE STATUS: 0 - Asymptomatic   General appearance: alert, cooperative and appears stated age Lymph nodes: Cervical, supraclavicular, and axillary nodes normal. Resp: clear to auscultation bilaterally and normal percussion bilaterally Cardio: regular rate and rhythm GI: soft, non-tender; bowel sounds normal; no masses,  no organomegaly Extremities: extremities normal, atraumatic, no cyanosis or edema Neurologic: Grossly normal   LABORATORY DATA: Lab Results  Component Value Date   WBC 5.0 01/02/2013   HGB 12.7 01/02/2013   HCT 36.3 01/02/2013   MCV 86.9 01/02/2013   PLT 232 01/02/2013      Chemistry      Component Value Date/Time   NA 142 01/02/2013 0857   K 4.0 01/02/2013 0857   CO2 24 01/02/2013 0857  BUN 12.4 01/02/2013 0857   CREATININE 0.8 01/02/2013 0857      Component Value Date/Time   CALCIUM 9.8 01/02/2013 0857   ALKPHOS 92 01/02/2013 0857   AST 18 01/02/2013 0857   ALT 29 01/02/2013 0857   BILITOT 0.48 01/02/2013 0857       RADIOGRAPHIC STUDIES:  No results found.  ASSESSMENT: 57 year old female with  #1 DCIS of the left breast measuring about 11 cm.  Tumor was ER positive PR positive. She did want to be abroad for a summer trip therefore we did initially put her on neoadjuvant daily Arimidex 1 mg daily. She now returns. She is ready to get her surgery performed. However she tells me that she is going for plastic surgery consultation at [facility] as well as at [facility]. She wants to be able to make a decision regarding her reconstruction. She is planning on having bilateral reconstruction performed. While she is planning on getting her opinion as I have recommended that she continue the anastrozole for as long as week prior to her surgery. I also asked if she would let me know what her final decision is.   PLAN:   #1 patient will proceed with second opinion regarding plastics.  #2 I will see her back in 2-3 months time for follow   All questions were answered. The patient knows to call the clinic with any problems, questions or concerns. We can certainly see the patient much sooner if necessary.  I spent 20 minutes counseling the patient face to face. The total time spent in the appointment was 25 minutes.    Drue Second, MD Medical/Oncology [facility] [phone] (beeper) [phone] (Office)

## 2013-03-05 ENCOUNTER — Other Ambulatory Visit: Payer: Self-pay

## 2013-03-10 ENCOUNTER — Encounter: Payer: Self-pay | Admitting: *Deleted

## 2013-03-10 NOTE — Progress Notes (Signed)
Mailed after appt letter to pt. 

## 2013-04-10 ENCOUNTER — Telehealth: Payer: Self-pay | Admitting: Oncology

## 2013-04-10 ENCOUNTER — Ambulatory Visit: Payer: BC Managed Care – PPO | Admitting: Oncology

## 2013-04-10 ENCOUNTER — Other Ambulatory Visit: Payer: BC Managed Care – PPO

## 2013-04-10 NOTE — Telephone Encounter (Signed)
, °

## 2013-06-03 ENCOUNTER — Other Ambulatory Visit: Payer: Self-pay | Admitting: *Deleted

## 2013-06-03 DIAGNOSIS — C50919 Malignant neoplasm of unspecified site of unspecified female breast: Secondary | ICD-10-CM

## 2013-06-04 ENCOUNTER — Other Ambulatory Visit: Payer: BC Managed Care – PPO

## 2013-06-04 ENCOUNTER — Ambulatory Visit: Payer: BC Managed Care – PPO | Admitting: Oncology

## 2015-10-05 DIAGNOSIS — H524 Presbyopia: Secondary | ICD-10-CM | POA: Diagnosis not present

## 2015-10-05 DIAGNOSIS — H40011 Open angle with borderline findings, low risk, right eye: Secondary | ICD-10-CM | POA: Diagnosis not present

## 2015-10-05 DIAGNOSIS — H40012 Open angle with borderline findings, low risk, left eye: Secondary | ICD-10-CM | POA: Diagnosis not present

## 2015-10-05 DIAGNOSIS — H25012 Cortical age-related cataract, left eye: Secondary | ICD-10-CM | POA: Diagnosis not present

## 2016-01-31 ENCOUNTER — Other Ambulatory Visit: Payer: Self-pay | Admitting: Family Medicine

## 2016-01-31 ENCOUNTER — Other Ambulatory Visit (HOSPITAL_COMMUNITY)
Admission: RE | Admit: 2016-01-31 | Discharge: 2016-01-31 | Disposition: A | Discharge status: Home / Self Care | Payer: BLUE CROSS/BLUE SHIELD | Source: Ambulatory Visit | Attending: Family Medicine | Admitting: Family Medicine

## 2016-01-31 DIAGNOSIS — Z0000 Encounter for general adult medical examination without abnormal findings: Secondary | ICD-10-CM | POA: Diagnosis not present

## 2016-01-31 DIAGNOSIS — E559 Vitamin D deficiency, unspecified: Secondary | ICD-10-CM | POA: Diagnosis not present

## 2016-01-31 DIAGNOSIS — Z01411 Encounter for gynecological examination (general) (routine) with abnormal findings: Secondary | ICD-10-CM | POA: Diagnosis not present

## 2016-01-31 DIAGNOSIS — E7800 Pure hypercholesterolemia, unspecified: Secondary | ICD-10-CM | POA: Diagnosis not present

## 2016-01-31 DIAGNOSIS — Z1159 Encounter for screening for other viral diseases: Secondary | ICD-10-CM | POA: Diagnosis not present

## 2016-02-02 LAB — CYTOLOGY - PAP

## 2016-02-09 DIAGNOSIS — Z1211 Encounter for screening for malignant neoplasm of colon: Secondary | ICD-10-CM | POA: Diagnosis not present

## 2016-06-08 DIAGNOSIS — H40013 Open angle with borderline findings, low risk, bilateral: Secondary | ICD-10-CM | POA: Diagnosis not present

## 2016-11-16 DIAGNOSIS — H25012 Cortical age-related cataract, left eye: Secondary | ICD-10-CM | POA: Diagnosis not present

## 2016-11-16 DIAGNOSIS — H524 Presbyopia: Secondary | ICD-10-CM | POA: Diagnosis not present

## 2016-11-16 DIAGNOSIS — H40013 Open angle with borderline findings, low risk, bilateral: Secondary | ICD-10-CM | POA: Diagnosis not present

## 2016-11-16 DIAGNOSIS — H43813 Vitreous degeneration, bilateral: Secondary | ICD-10-CM | POA: Diagnosis not present

## 2017-05-20 DIAGNOSIS — H40013 Open angle with borderline findings, low risk, bilateral: Secondary | ICD-10-CM | POA: Diagnosis not present

## 2017-05-30 DIAGNOSIS — E559 Vitamin D deficiency, unspecified: Secondary | ICD-10-CM | POA: Diagnosis not present

## 2017-05-30 DIAGNOSIS — E7800 Pure hypercholesterolemia, unspecified: Secondary | ICD-10-CM | POA: Diagnosis not present

## 2017-05-30 DIAGNOSIS — Z1211 Encounter for screening for malignant neoplasm of colon: Secondary | ICD-10-CM | POA: Diagnosis not present

## 2017-05-30 DIAGNOSIS — Z0000 Encounter for general adult medical examination without abnormal findings: Secondary | ICD-10-CM | POA: Diagnosis not present

## 2017-09-04 DIAGNOSIS — E559 Vitamin D deficiency, unspecified: Secondary | ICD-10-CM | POA: Diagnosis not present

## 2017-12-13 DIAGNOSIS — H25012 Cortical age-related cataract, left eye: Secondary | ICD-10-CM | POA: Diagnosis not present

## 2017-12-13 DIAGNOSIS — H2513 Age-related nuclear cataract, bilateral: Secondary | ICD-10-CM | POA: Diagnosis not present

## 2017-12-13 DIAGNOSIS — H40013 Open angle with borderline findings, low risk, bilateral: Secondary | ICD-10-CM | POA: Diagnosis not present

## 2018-07-21 ENCOUNTER — Other Ambulatory Visit: Payer: Self-pay | Admitting: Family Medicine

## 2018-07-21 ENCOUNTER — Other Ambulatory Visit (HOSPITAL_COMMUNITY)
Admission: RE | Admit: 2018-07-21 | Discharge: 2018-07-21 | Disposition: A | Discharge status: Home / Self Care | Payer: BLUE CROSS/BLUE SHIELD | Source: Ambulatory Visit | Attending: Family Medicine | Admitting: Family Medicine

## 2018-07-21 DIAGNOSIS — E7800 Pure hypercholesterolemia, unspecified: Secondary | ICD-10-CM | POA: Diagnosis not present

## 2018-07-21 DIAGNOSIS — Z0000 Encounter for general adult medical examination without abnormal findings: Secondary | ICD-10-CM | POA: Diagnosis not present

## 2018-07-21 DIAGNOSIS — E559 Vitamin D deficiency, unspecified: Secondary | ICD-10-CM | POA: Diagnosis not present

## 2018-07-21 DIAGNOSIS — Z01411 Encounter for gynecological examination (general) (routine) with abnormal findings: Secondary | ICD-10-CM | POA: Diagnosis not present

## 2018-07-22 LAB — CYTOLOGY - PAP
DIAGNOSIS: NEGATIVE
HPV: NOT DETECTED

## 2018-12-16 DIAGNOSIS — H5213 Myopia, bilateral: Secondary | ICD-10-CM | POA: Diagnosis not present

## 2018-12-16 DIAGNOSIS — H2513 Age-related nuclear cataract, bilateral: Secondary | ICD-10-CM | POA: Diagnosis not present

## 2018-12-16 DIAGNOSIS — H25013 Cortical age-related cataract, bilateral: Secondary | ICD-10-CM | POA: Diagnosis not present

## 2018-12-16 DIAGNOSIS — H40013 Open angle with borderline findings, low risk, bilateral: Secondary | ICD-10-CM | POA: Diagnosis not present

## 2019-04-20 ENCOUNTER — Other Ambulatory Visit: Payer: Self-pay | Admitting: Cardiology

## 2019-04-20 DIAGNOSIS — Z20822 Contact with and (suspected) exposure to covid-19: Secondary | ICD-10-CM

## 2019-04-20 DIAGNOSIS — Z20828 Contact with and (suspected) exposure to other viral communicable diseases: Secondary | ICD-10-CM | POA: Diagnosis not present

## 2019-04-21 LAB — NOVEL CORONAVIRUS, NAA: SARS-CoV-2, NAA: NOT DETECTED

## 2019-08-21 DIAGNOSIS — E7800 Pure hypercholesterolemia, unspecified: Secondary | ICD-10-CM | POA: Diagnosis not present

## 2019-08-21 DIAGNOSIS — E559 Vitamin D deficiency, unspecified: Secondary | ICD-10-CM | POA: Diagnosis not present

## 2019-08-21 DIAGNOSIS — Z0000 Encounter for general adult medical examination without abnormal findings: Secondary | ICD-10-CM | POA: Diagnosis not present

## 2019-08-26 DIAGNOSIS — Z1211 Encounter for screening for malignant neoplasm of colon: Secondary | ICD-10-CM | POA: Diagnosis not present

## 2020-02-17 ENCOUNTER — Other Ambulatory Visit: Payer: Self-pay

## 2020-02-17 DIAGNOSIS — Z20822 Contact with and (suspected) exposure to covid-19: Secondary | ICD-10-CM

## 2020-02-18 LAB — NOVEL CORONAVIRUS, NAA: SARS-CoV-2, NAA: NOT DETECTED

## 2020-02-18 LAB — SARS-COV-2, NAA 2 DAY TAT

## 2020-04-06 DIAGNOSIS — H25013 Cortical age-related cataract, bilateral: Secondary | ICD-10-CM | POA: Diagnosis not present

## 2020-04-06 DIAGNOSIS — H40013 Open angle with borderline findings, low risk, bilateral: Secondary | ICD-10-CM | POA: Diagnosis not present

## 2020-04-06 DIAGNOSIS — H2513 Age-related nuclear cataract, bilateral: Secondary | ICD-10-CM | POA: Diagnosis not present

## 2020-05-23 DIAGNOSIS — U071 COVID-19: Secondary | ICD-10-CM | POA: Diagnosis not present

## 2020-05-23 DIAGNOSIS — Z20822 Contact with and (suspected) exposure to covid-19: Secondary | ICD-10-CM | POA: Diagnosis not present

## 2020-06-10 DIAGNOSIS — Z20822 Contact with and (suspected) exposure to covid-19: Secondary | ICD-10-CM | POA: Diagnosis not present

## 2020-06-10 DIAGNOSIS — Z03818 Encounter for observation for suspected exposure to other biological agents ruled out: Secondary | ICD-10-CM | POA: Diagnosis not present

## 2020-06-21 DIAGNOSIS — Z03818 Encounter for observation for suspected exposure to other biological agents ruled out: Secondary | ICD-10-CM | POA: Diagnosis not present

## 2020-06-21 DIAGNOSIS — Z20822 Contact with and (suspected) exposure to covid-19: Secondary | ICD-10-CM | POA: Diagnosis not present

## 2021-01-05 DIAGNOSIS — E7800 Pure hypercholesterolemia, unspecified: Secondary | ICD-10-CM | POA: Diagnosis not present

## 2021-01-05 DIAGNOSIS — Z0000 Encounter for general adult medical examination without abnormal findings: Secondary | ICD-10-CM | POA: Diagnosis not present

## 2021-01-05 DIAGNOSIS — E559 Vitamin D deficiency, unspecified: Secondary | ICD-10-CM | POA: Diagnosis not present

## 2021-01-05 DIAGNOSIS — I10 Essential (primary) hypertension: Secondary | ICD-10-CM | POA: Diagnosis not present

## 2021-01-05 DIAGNOSIS — Z23 Encounter for immunization: Secondary | ICD-10-CM | POA: Diagnosis not present

## 2021-01-16 DIAGNOSIS — Z1211 Encounter for screening for malignant neoplasm of colon: Secondary | ICD-10-CM | POA: Diagnosis not present

## 2021-03-17 DIAGNOSIS — Z23 Encounter for immunization: Secondary | ICD-10-CM | POA: Diagnosis not present

## 2021-04-10 DIAGNOSIS — H40013 Open angle with borderline findings, low risk, bilateral: Secondary | ICD-10-CM | POA: Diagnosis not present

## 2021-04-10 DIAGNOSIS — H43813 Vitreous degeneration, bilateral: Secondary | ICD-10-CM | POA: Diagnosis not present

## 2021-04-10 DIAGNOSIS — H2513 Age-related nuclear cataract, bilateral: Secondary | ICD-10-CM | POA: Diagnosis not present

## 2021-06-15 DIAGNOSIS — R609 Edema, unspecified: Secondary | ICD-10-CM | POA: Diagnosis not present

## 2021-07-18 ENCOUNTER — Ambulatory Visit (HOSPITAL_COMMUNITY): Payer: Self-pay

## 2021-07-19 ENCOUNTER — Ambulatory Visit (HOSPITAL_BASED_OUTPATIENT_CLINIC_OR_DEPARTMENT_OTHER)
Admission: RE | Admit: 2021-07-19 | Discharge: 2021-07-19 | Disposition: A | Discharge status: Home / Self Care | Payer: BC Managed Care – PPO | Source: Ambulatory Visit | Attending: Family Medicine | Admitting: Family Medicine

## 2021-07-19 ENCOUNTER — Other Ambulatory Visit (HOSPITAL_BASED_OUTPATIENT_CLINIC_OR_DEPARTMENT_OTHER): Payer: Self-pay | Admitting: Family Medicine

## 2021-07-19 ENCOUNTER — Other Ambulatory Visit: Payer: Self-pay

## 2021-07-19 DIAGNOSIS — M7989 Other specified soft tissue disorders: Secondary | ICD-10-CM | POA: Insufficient documentation

## 2021-07-20 DIAGNOSIS — K449 Diaphragmatic hernia without obstruction or gangrene: Secondary | ICD-10-CM | POA: Diagnosis not present

## 2021-07-20 DIAGNOSIS — R2242 Localized swelling, mass and lump, left lower limb: Secondary | ICD-10-CM | POA: Diagnosis not present

## 2021-07-21 DIAGNOSIS — M79652 Pain in left thigh: Secondary | ICD-10-CM | POA: Diagnosis not present

## 2021-07-21 DIAGNOSIS — M71359 Other bursal cyst, unspecified hip: Secondary | ICD-10-CM | POA: Diagnosis not present

## 2021-07-21 DIAGNOSIS — M24851 Other specific joint derangements of right hip, not elsewhere classified: Secondary | ICD-10-CM | POA: Diagnosis not present

## 2021-07-21 DIAGNOSIS — M24852 Other specific joint derangements of left hip, not elsewhere classified: Secondary | ICD-10-CM | POA: Diagnosis not present

## 2021-07-26 DIAGNOSIS — M25562 Pain in left knee: Secondary | ICD-10-CM | POA: Diagnosis not present

## 2021-07-26 DIAGNOSIS — M25561 Pain in right knee: Secondary | ICD-10-CM | POA: Diagnosis not present

## 2021-07-31 DIAGNOSIS — M24852 Other specific joint derangements of left hip, not elsewhere classified: Secondary | ICD-10-CM | POA: Diagnosis not present

## 2021-07-31 DIAGNOSIS — M24851 Other specific joint derangements of right hip, not elsewhere classified: Secondary | ICD-10-CM | POA: Diagnosis not present

## 2021-08-25 DIAGNOSIS — M79652 Pain in left thigh: Secondary | ICD-10-CM | POA: Diagnosis not present

## 2021-08-25 DIAGNOSIS — M24851 Other specific joint derangements of right hip, not elsewhere classified: Secondary | ICD-10-CM | POA: Diagnosis not present

## 2021-10-23 DIAGNOSIS — Z96641 Presence of right artificial hip joint: Secondary | ICD-10-CM | POA: Diagnosis not present

## 2021-10-23 DIAGNOSIS — M160 Bilateral primary osteoarthritis of hip: Secondary | ICD-10-CM | POA: Diagnosis not present

## 2021-10-23 DIAGNOSIS — M1612 Unilateral primary osteoarthritis, left hip: Secondary | ICD-10-CM | POA: Diagnosis not present

## 2021-11-07 DIAGNOSIS — M1612 Unilateral primary osteoarthritis, left hip: Secondary | ICD-10-CM | POA: Diagnosis not present

## 2021-11-07 DIAGNOSIS — Z01818 Encounter for other preprocedural examination: Secondary | ICD-10-CM | POA: Diagnosis not present

## 2021-12-26 DIAGNOSIS — M1612 Unilateral primary osteoarthritis, left hip: Secondary | ICD-10-CM | POA: Diagnosis not present

## 2021-12-26 DIAGNOSIS — D0510 Intraductal carcinoma in situ of unspecified breast: Secondary | ICD-10-CM | POA: Diagnosis not present

## 2021-12-26 DIAGNOSIS — Z9889 Other specified postprocedural states: Secondary | ICD-10-CM | POA: Diagnosis not present

## 2021-12-26 DIAGNOSIS — M25552 Pain in left hip: Secondary | ICD-10-CM | POA: Diagnosis not present

## 2021-12-26 DIAGNOSIS — Z8639 Personal history of other endocrine, nutritional and metabolic disease: Secondary | ICD-10-CM | POA: Diagnosis not present

## 2022-01-03 DIAGNOSIS — M1612 Unilateral primary osteoarthritis, left hip: Secondary | ICD-10-CM | POA: Diagnosis not present

## 2022-01-03 DIAGNOSIS — Z6833 Body mass index (BMI) 33.0-33.9, adult: Secondary | ICD-10-CM | POA: Diagnosis not present

## 2022-01-03 DIAGNOSIS — M25552 Pain in left hip: Secondary | ICD-10-CM | POA: Diagnosis not present

## 2022-01-03 DIAGNOSIS — Z4789 Encounter for other orthopedic aftercare: Secondary | ICD-10-CM | POA: Diagnosis not present

## 2022-01-03 DIAGNOSIS — E668 Other obesity: Secondary | ICD-10-CM | POA: Diagnosis not present

## 2022-01-03 DIAGNOSIS — G8929 Other chronic pain: Secondary | ICD-10-CM | POA: Diagnosis not present

## 2022-02-05 DIAGNOSIS — E7800 Pure hypercholesterolemia, unspecified: Secondary | ICD-10-CM | POA: Diagnosis not present

## 2022-02-05 DIAGNOSIS — M8580 Other specified disorders of bone density and structure, unspecified site: Secondary | ICD-10-CM | POA: Diagnosis not present

## 2022-02-05 DIAGNOSIS — M1909 Primary osteoarthritis, other specified site: Secondary | ICD-10-CM | POA: Diagnosis not present

## 2022-02-05 DIAGNOSIS — M170 Bilateral primary osteoarthritis of knee: Secondary | ICD-10-CM | POA: Diagnosis not present

## 2022-02-05 DIAGNOSIS — E559 Vitamin D deficiency, unspecified: Secondary | ICD-10-CM | POA: Diagnosis not present

## 2022-02-05 DIAGNOSIS — M2170 Unequal limb length (acquired), unspecified site: Secondary | ICD-10-CM | POA: Diagnosis not present

## 2022-02-05 DIAGNOSIS — Z0000 Encounter for general adult medical examination without abnormal findings: Secondary | ICD-10-CM | POA: Diagnosis not present

## 2022-02-07 DIAGNOSIS — Z0000 Encounter for general adult medical examination without abnormal findings: Secondary | ICD-10-CM | POA: Diagnosis not present

## 2022-02-09 DIAGNOSIS — Z1211 Encounter for screening for malignant neoplasm of colon: Secondary | ICD-10-CM | POA: Diagnosis not present

## 2022-04-10 DIAGNOSIS — H25013 Cortical age-related cataract, bilateral: Secondary | ICD-10-CM | POA: Diagnosis not present

## 2022-04-10 DIAGNOSIS — H5213 Myopia, bilateral: Secondary | ICD-10-CM | POA: Diagnosis not present

## 2022-04-10 DIAGNOSIS — H40013 Open angle with borderline findings, low risk, bilateral: Secondary | ICD-10-CM | POA: Diagnosis not present

## 2022-04-10 DIAGNOSIS — H524 Presbyopia: Secondary | ICD-10-CM | POA: Diagnosis not present

## 2022-04-10 DIAGNOSIS — H43813 Vitreous degeneration, bilateral: Secondary | ICD-10-CM | POA: Diagnosis not present

## 2022-04-10 DIAGNOSIS — H2513 Age-related nuclear cataract, bilateral: Secondary | ICD-10-CM | POA: Diagnosis not present

## 2022-04-20 DIAGNOSIS — M25551 Pain in right hip: Secondary | ICD-10-CM | POA: Diagnosis not present

## 2022-04-20 DIAGNOSIS — R269 Unspecified abnormalities of gait and mobility: Secondary | ICD-10-CM | POA: Diagnosis not present

## 2022-05-07 DIAGNOSIS — R269 Unspecified abnormalities of gait and mobility: Secondary | ICD-10-CM | POA: Diagnosis not present

## 2022-05-07 DIAGNOSIS — M25551 Pain in right hip: Secondary | ICD-10-CM | POA: Diagnosis not present

## 2022-05-14 DIAGNOSIS — M25551 Pain in right hip: Secondary | ICD-10-CM | POA: Diagnosis not present

## 2022-05-14 DIAGNOSIS — R269 Unspecified abnormalities of gait and mobility: Secondary | ICD-10-CM | POA: Diagnosis not present

## 2023-03-01 DIAGNOSIS — Z0000 Encounter for general adult medical examination without abnormal findings: Secondary | ICD-10-CM | POA: Diagnosis not present

## 2023-03-01 DIAGNOSIS — Z79899 Other long term (current) drug therapy: Secondary | ICD-10-CM | POA: Diagnosis not present

## 2023-03-01 DIAGNOSIS — E7800 Pure hypercholesterolemia, unspecified: Secondary | ICD-10-CM | POA: Diagnosis not present

## 2023-03-01 DIAGNOSIS — E559 Vitamin D deficiency, unspecified: Secondary | ICD-10-CM | POA: Diagnosis not present

## 2023-03-05 DIAGNOSIS — Z1211 Encounter for screening for malignant neoplasm of colon: Secondary | ICD-10-CM | POA: Diagnosis not present

## 2023-04-16 DIAGNOSIS — H43813 Vitreous degeneration, bilateral: Secondary | ICD-10-CM | POA: Diagnosis not present

## 2023-04-16 DIAGNOSIS — H2513 Age-related nuclear cataract, bilateral: Secondary | ICD-10-CM | POA: Diagnosis not present

## 2023-04-16 DIAGNOSIS — H40013 Open angle with borderline findings, low risk, bilateral: Secondary | ICD-10-CM | POA: Diagnosis not present

## 2023-04-16 DIAGNOSIS — H524 Presbyopia: Secondary | ICD-10-CM | POA: Diagnosis not present

## 2023-04-16 DIAGNOSIS — H5213 Myopia, bilateral: Secondary | ICD-10-CM | POA: Diagnosis not present

## 2023-09-05 DIAGNOSIS — L820 Inflamed seborrheic keratosis: Secondary | ICD-10-CM | POA: Diagnosis not present

## 2023-09-05 DIAGNOSIS — D2372 Other benign neoplasm of skin of left lower limb, including hip: Secondary | ICD-10-CM | POA: Diagnosis not present

## 2023-10-19 IMAGING — US US EXTREM LOW VENOUS*L*
1 series · 13 of 24 positions shown · non-contrast
Comparison: None.

CLINICAL DATA: Left leg swelling.



[Series 1: us venous img lower uni left (dvt) · portal-venous · 59 acquisitions, 13 frames shown]
[im 1/59]
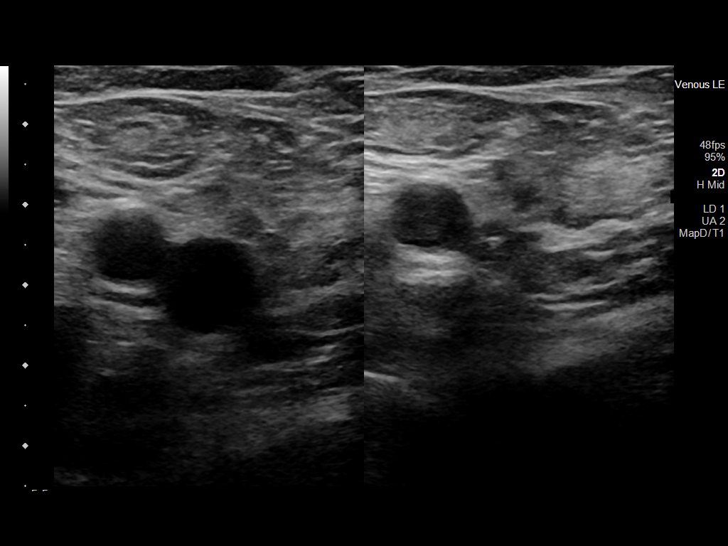
[im 6/59]
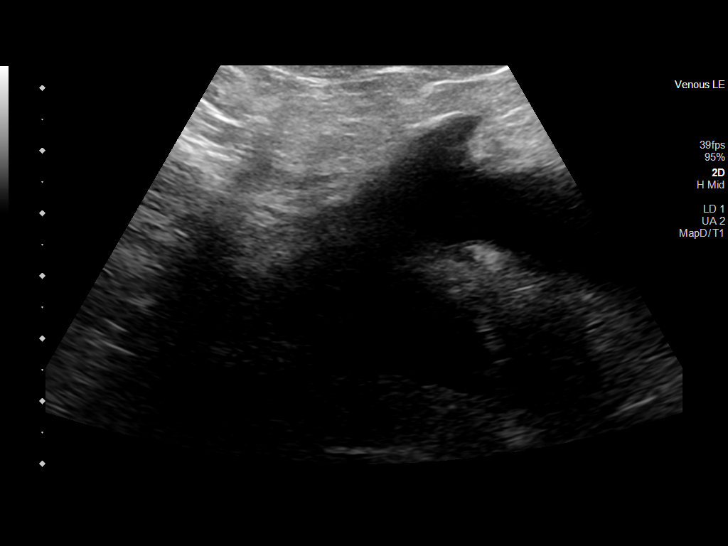
[im 11/59]
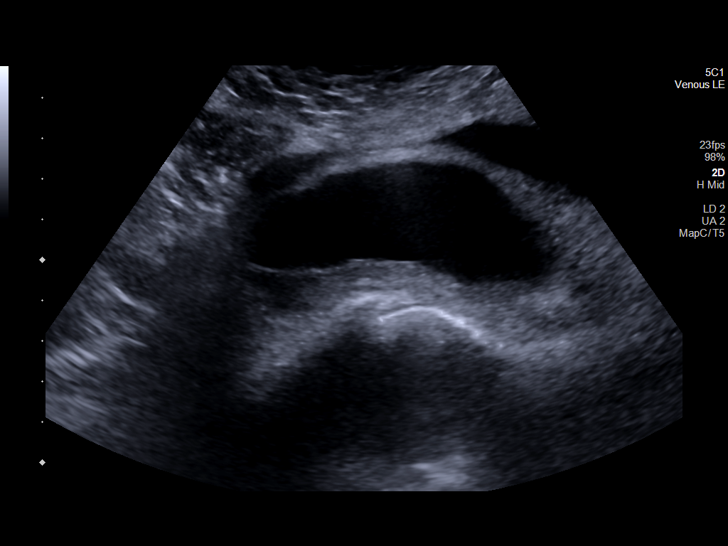
[im 16/59]
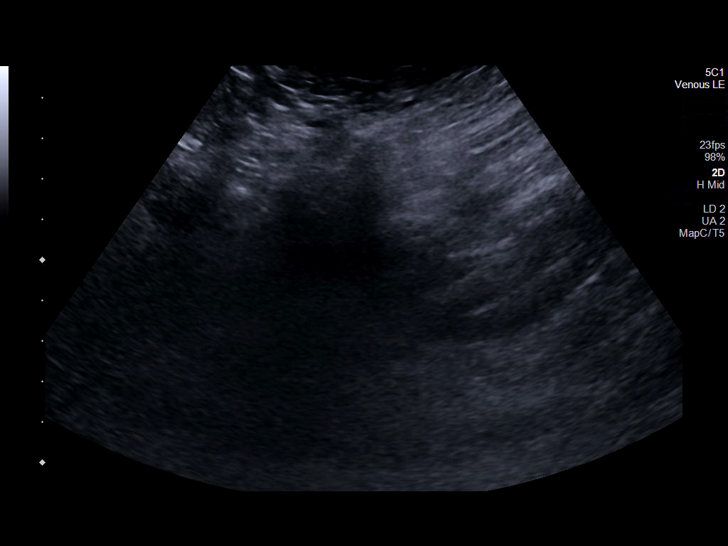
[im 18/59]
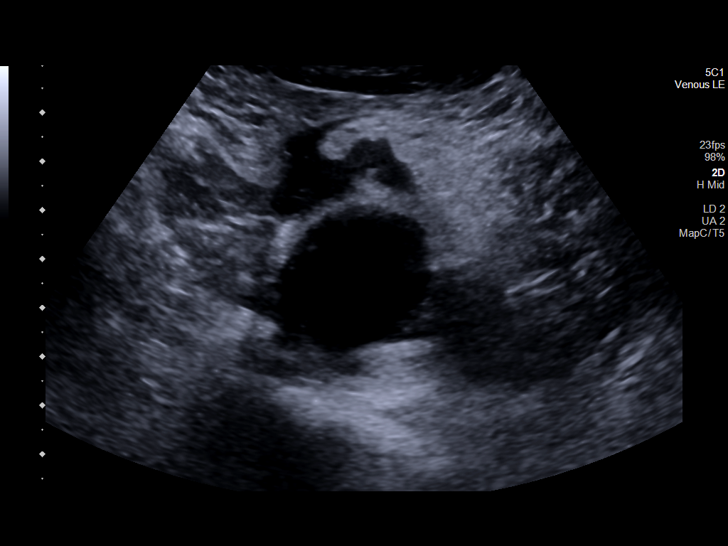
[im 23/59]
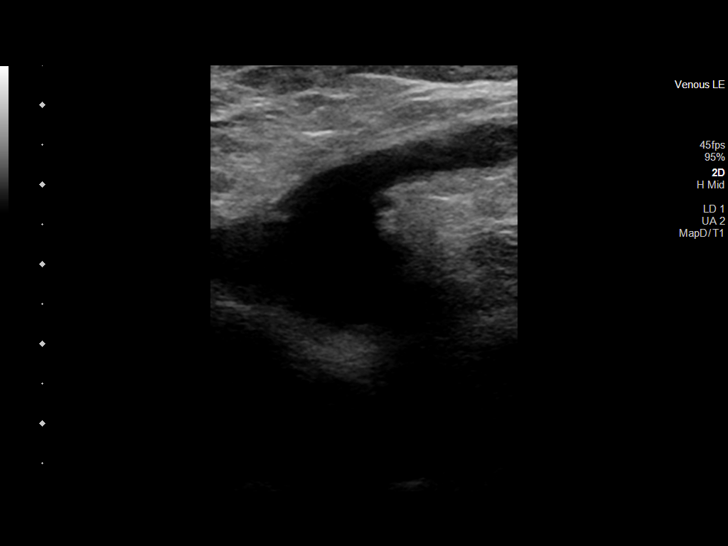
[im 31/59]
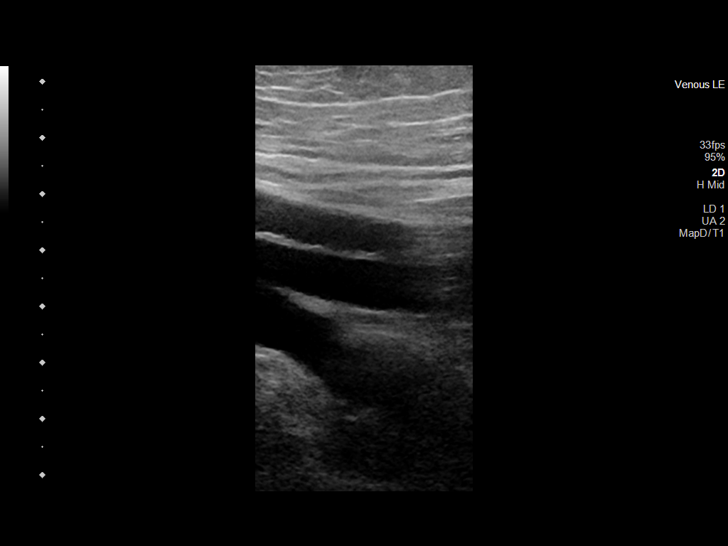
[im 33/59]
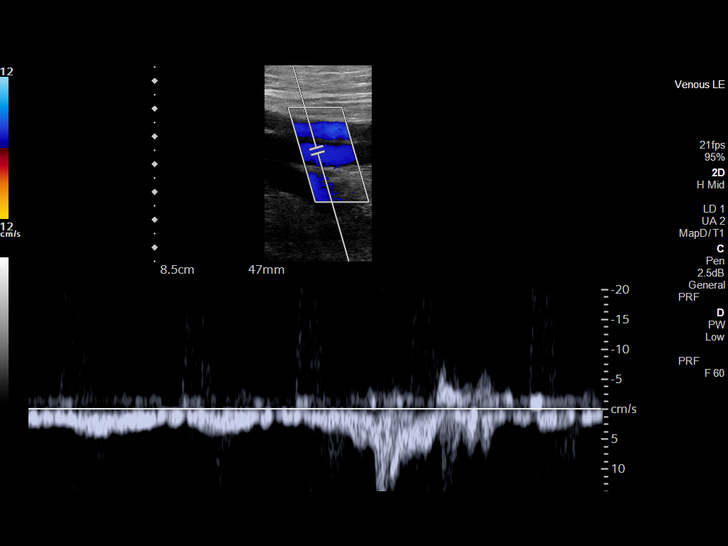
[im 38/59]
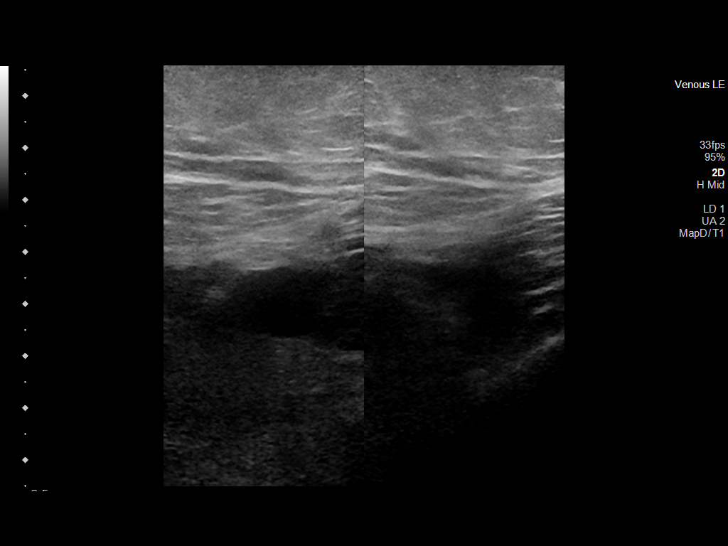
[im 43/59]
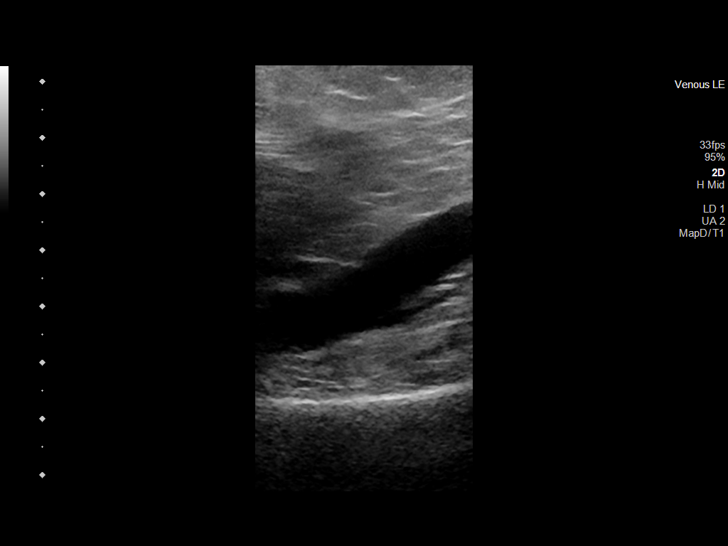
[im 48/59]
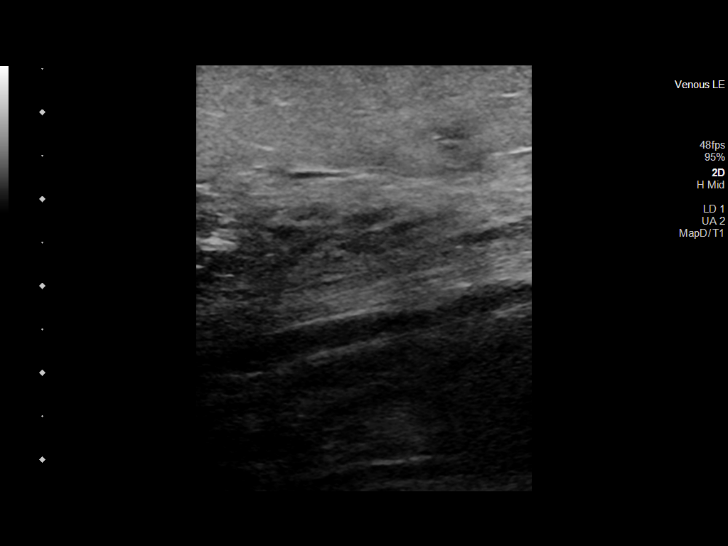
[im 53/59]
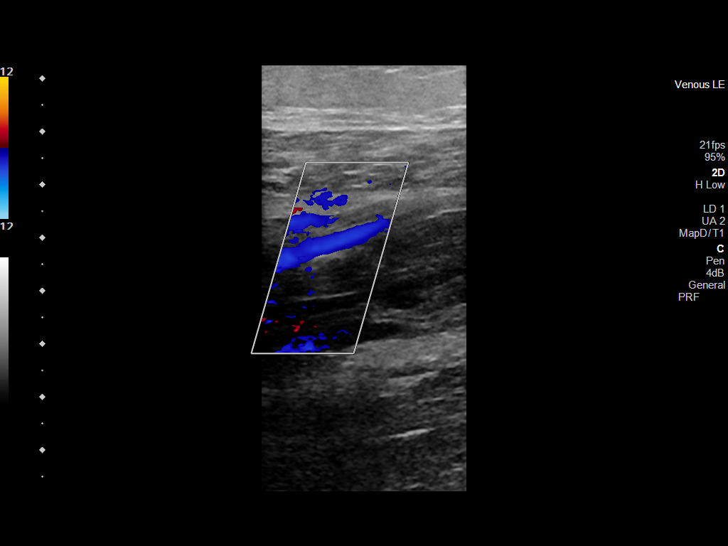
[im 59/59]
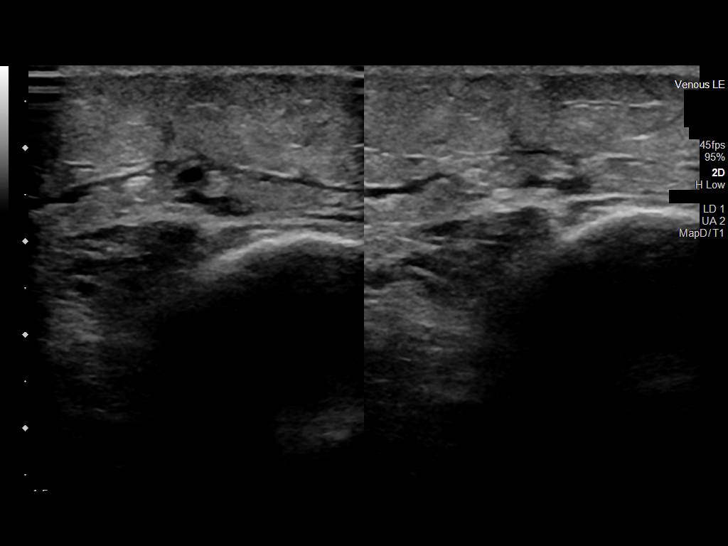

[13 of 24 positions shown; findings below may reference images not displayed]

FINDINGS: Contralateral Common Femoral Vein: Respiratory phasicity is normal
and symmetric with the symptomatic side. No evidence of thrombus.
Normal compressibility.

Common Femoral Vein: No evidence of thrombus. Normal
compressibility, respiratory phasicity and response to augmentation.

Saphenofemoral Junction: No evidence of thrombus. Normal
compressibility and flow on color Doppler imaging.

Profunda Femoral Vein: No evidence of thrombus. Normal
compressibility and flow on color Doppler imaging.

Femoral Vein: No evidence of thrombus. Normal compressibility,
respiratory phasicity and response to augmentation.

Popliteal Vein: No evidence of thrombus. Normal compressibility,
respiratory phasicity and response to augmentation.

Calf Veins: No evidence of thrombus. Normal compressibility and flow
on color Doppler imaging.

Superficial Great Saphenous Vein: No evidence of thrombus. Normal
compressibility.

Venous Reflux:  None.

Other Findings: Large collection in the left groin posterior to the
left common femoral vein. This structure is predominantly anechoic
with increased through transmission and a few internal echoes. This
cystic structure measures 8.6 x 3.2 x 3.3 cm. No definite vascular
flow within this structure.
IMPRESSION: 1.  Negative for deep venous thrombosis in left lower extremity.
2. Mildly complex fluid collection posterior to left common femoral
vein, measuring up to 8.6 cm. Etiology is indeterminate. Consider
further characterization with CT.

## 2024-04-16 DIAGNOSIS — H40013 Open angle with borderline findings, low risk, bilateral: Secondary | ICD-10-CM | POA: Diagnosis not present

## 2024-04-16 DIAGNOSIS — H43813 Vitreous degeneration, bilateral: Secondary | ICD-10-CM | POA: Diagnosis not present

## 2024-04-16 DIAGNOSIS — H2513 Age-related nuclear cataract, bilateral: Secondary | ICD-10-CM | POA: Diagnosis not present

## 2024-04-16 DIAGNOSIS — H5213 Myopia, bilateral: Secondary | ICD-10-CM | POA: Diagnosis not present

## 2024-04-16 DIAGNOSIS — H524 Presbyopia: Secondary | ICD-10-CM | POA: Diagnosis not present

## 2024-04-16 DIAGNOSIS — H25013 Cortical age-related cataract, bilateral: Secondary | ICD-10-CM | POA: Diagnosis not present

## 2024-05-03 ENCOUNTER — Other Ambulatory Visit (HOSPITAL_BASED_OUTPATIENT_CLINIC_OR_DEPARTMENT_OTHER): Payer: Self-pay | Admitting: Family Medicine

## 2024-05-03 DIAGNOSIS — E2839 Other primary ovarian failure: Secondary | ICD-10-CM
